# Patient Record
Sex: Female | Born: 1995 | Race: Black or African American | Hispanic: No | Marital: Single | Smoking: Never smoker
Health system: Southern US, Community
[De-identification: ages and names within clinical notes are randomized; demographics above are authoritative.]

## PROBLEM LIST (undated history)

## (undated) DIAGNOSIS — J45909 Unspecified asthma, uncomplicated: Secondary | ICD-10-CM

---
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
## 2017-01-16 ENCOUNTER — Encounter (HOSPITAL_COMMUNITY): Payer: Self-pay | Admitting: Emergency Medicine

## 2017-01-16 ENCOUNTER — Emergency Department (HOSPITAL_COMMUNITY): Payer: Managed Care, Other (non HMO)

## 2017-01-16 ENCOUNTER — Emergency Department (HOSPITAL_COMMUNITY)
Admission: EM | Admit: 2017-01-16 | Discharge: 2017-01-16 | Disposition: A | Discharge status: Home / Self Care | Payer: Managed Care, Other (non HMO) | Attending: Emergency Medicine | Admitting: Emergency Medicine

## 2017-01-16 ENCOUNTER — Other Ambulatory Visit: Payer: Self-pay

## 2017-01-16 DIAGNOSIS — Z79899 Other long term (current) drug therapy: Secondary | ICD-10-CM | POA: Diagnosis not present

## 2017-01-16 DIAGNOSIS — R0602 Shortness of breath: Secondary | ICD-10-CM | POA: Diagnosis present

## 2017-01-16 DIAGNOSIS — J4521 Mild intermittent asthma with (acute) exacerbation: Secondary | ICD-10-CM

## 2017-01-16 MED ORDER — IPRATROPIUM BROMIDE 0.02 % IN SOLN
0.5000 mg | Freq: Once | RESPIRATORY_TRACT | Status: AC
  Administered 2017-01-16: 0.5 mg via RESPIRATORY_TRACT
  Filled 2017-01-16: qty 2.5

## 2017-01-16 MED ORDER — PREDNISONE 20 MG PO TABS
60.0000 mg | ORAL_TABLET | Freq: Once | ORAL | Status: AC
  Administered 2017-01-16: 60 mg via ORAL
  Filled 2017-01-16: qty 3

## 2017-01-16 MED ORDER — ALBUTEROL (5 MG/ML) CONTINUOUS INHALATION SOLN
10.0000 mg/h | INHALATION_SOLUTION | Freq: Once | RESPIRATORY_TRACT | Status: AC
  Administered 2017-01-16: 10 mg/h via RESPIRATORY_TRACT
  Filled 2017-01-16: qty 20

## 2017-01-16 MED ORDER — PREDNISONE 20 MG PO TABS: ORAL_TABLET | ORAL | 0 refills | Status: DC

## 2017-01-16 MED ORDER — ALBUTEROL SULFATE (2.5 MG/3ML) 0.083% IN NEBU
INHALATION_SOLUTION | RESPIRATORY_TRACT | Status: AC
  Filled 2017-01-16: qty 6

## 2017-01-16 MED ORDER — ALBUTEROL SULFATE (2.5 MG/3ML) 0.083% IN NEBU
5.0000 mg | INHALATION_SOLUTION | Freq: Once | RESPIRATORY_TRACT | Status: AC
  Administered 2017-01-16: 5 mg via RESPIRATORY_TRACT

## 2017-01-16 MED ORDER — AZITHROMYCIN 250 MG PO TABS: ORAL_TABLET | ORAL | 0 refills | Status: DC

## 2017-01-16 MED ORDER — BENZONATATE 100 MG PO CAPS: 100.0000 mg | ORAL_CAPSULE | Freq: Three times a day (TID) | ORAL | 0 refills | Status: DC

## 2017-01-16 NOTE — ED Notes (Signed)
Patient verbalized understanding of discharge instructions and denies any further needs or questions at this time. VS stable. Patient ambulatory with steady gait.  

## 2017-01-16 NOTE — ED Provider Notes (Signed)
[facility] EMERGENCY [facility] Provider Note   CSN: [id number] Arrival date & time: 01/16/17  1332     History   Chief Complaint Chief Complaint  Patient presents with  . Wheezing  . Cough    HPI Janet Armstrong is a 21 y.o. female.  HPI   21 year old female with history of asthma presenting for evaluation of shortness of breath.  Since last night patient developed congestion, wheezing, increased shortness of breath, and pleuritic chest pain.  Symptoms not adequately controlled with home albuterol inhaler.  He has been using it around the clock without relief.  She does not report any fever, chills, headache, dizziness, nausea vomiting diarrhea abdominal pain or back pain or rash.  Her son is also sick.  She mentioned her asthma usually is well controlled however she normally have asthma flare around the wintertime.  Past Medical History:  Diagnosis Date  . Asthma     There are no active problems to display for this patient.   History reviewed. No pertinent surgical history.  OB History    No data available       Home Medications    Prior to Admission medications   Medication Sig Start Date End Date Taking? Authorizing Provider  albuterol (PROVENTIL HFA;VENTOLIN HFA) 108 (90 Base) MCG/ACT inhaler Inhale 2 puffs into the lungs every 6 (six) hours as needed for wheezing or shortness of breath.  08/28/16  Yes [provider]  etonogestrel (NEXPLANON) 68 MG IMPL implant 1 each by Subdermal route See admin instructions. 1 implant every three years 06/07/15  Yes [provider]  fluticasone (FLONASE) 50 MCG/ACT nasal spray Place 2 sprays into both nostrils at bedtime. Patient taking differently: Place 2 sprays into both nostrils at bedtime as needed for allergies or rhinitis.  02/27/14  Yes Ozella RocksMerrell, David J, MD  sertraline (ZOLOFT) 25 MG tablet Take 50 mg by mouth daily. 11/13/16  Yes [provider]  amoxicillin-clavulanate  (AUGMENTIN) 875-125 MG per tablet Take 1 tablet by mouth 2 (two) times daily. Patient not taking: Reported on 01/16/2017 02/27/14   Ozella RocksMerrell, David J, MD  ipratropium (ATROVENT) 0.06 % nasal spray Place 2 sprays into both nostrils 4 (four) times daily. Patient not taking: Reported on 01/16/2017 02/27/14   Ozella RocksMerrell, David J, MD    Family History Family History  Problem Relation Age of Onset  . Diabetes Other   . Hypertension Other   . Cancer Other     Social History Social History   Tobacco Use  . Smoking status: Never Smoker  . Smokeless tobacco: Never Used  Substance Use Topics  . Alcohol use: No  . Drug use: No     Allergies   Beef-derived products and Pork-derived products   Review of Systems Review of Systems  All other systems reviewed and are negative.    Physical Exam Updated Vital Signs BP 123/79 (BP Location: Right Arm)   Pulse (!) 110   Temp 98.6 F (37 C) (Oral)   Resp 20   SpO2 98%   Physical Exam  Constitutional: She appears well-developed and well-nourished. No distress.  HENT:  Head: Atraumatic.  Right Ear: External ear normal.  Left Ear: External ear normal.  Nose: Nose normal.  Mouth/Throat: Oropharynx is clear and moist.  Eyes: Conjunctivae are normal.  Neck: Normal range of motion. Neck supple.  Cardiovascular: Intact distal pulses.  Tachycardia without murmur rubs or gallops  Pulmonary/Chest: No stridor. She has wheezes. She has no rales.  Abdominal: Soft. Bowel sounds are normal.  Musculoskeletal: She exhibits no edema.  Lymphadenopathy:    She has no cervical adenopathy.  Neurological: She is alert.  Skin: No rash noted.  Psychiatric: She has a normal mood and affect.  Nursing note and vitals reviewed.    ED Treatments / Results  Labs (all labs ordered are listed, but only abnormal results are displayed) Labs Reviewed - No data to display  EKG  EKG Interpretation None      Date: 01/16/2017  Rate: 98  Rhythm: normal sinus  rhythm with sinus arrhythmia  QRS Axis: normal  Intervals: normal  ST/T Wave abnormalities: normal  Conduction Disutrbances: none  Narrative Interpretation:   Old EKG Reviewed: No significant changes noted     Radiology Dg Chest 2 View  Result Date: 01/16/2017 CLINICAL DATA:  Asthma.  Cough, wheezing, and shortness of breath. EXAM: CHEST  2 VIEW COMPARISON:  None FINDINGS: There is a small focal area of infiltrate/ atelectasis seen anteriorly on the lateral view, either in the right middle lobe or lingula. The lungs are otherwise clear. No effusions. Heart size and vascularity are normal. No bone abnormality. IMPRESSION: Focal area of infiltrate/atelectasis either in the lingula or right middle lobe. I favor it being in the lingula. Electronically Signed   By: Francene BoyersJames  Maxwell M.D.   On: 01/16/2017 15:28    Procedures Procedures (including critical care time)  Medications Ordered in ED Medications  albuterol (PROVENTIL) (2.5 MG/3ML) 0.083% nebulizer solution 5 mg ( Nebulization Not Given 01/16/17 1436)  predniSONE (DELTASONE) tablet 60 mg (60 mg Oral Given 01/16/17 1738)  albuterol (PROVENTIL,VENTOLIN) solution continuous neb (10 mg/hr Nebulization Given 01/16/17 1742)  ipratropium (ATROVENT) nebulizer solution 0.5 mg (0.5 mg Nebulization Given 01/16/17 1742)     Initial Impression / Assessment and Plan / ED Course  I have reviewed the triage vital signs and the nursing notes.  Pertinent labs & imaging results that were available during my care of the patient were reviewed by me and considered in my medical decision making (see chart for details).     BP 118/75 (BP Location: Right Arm)   Pulse (!) 108   Temp 98.6 F (37 C) (Oral)   Resp 18   SpO2 100%    Final Clinical Impressions(s) / ED Diagnoses   Final diagnoses:  Mild intermittent asthma with exacerbation    ED Discharge Orders        Ordered    predniSONE (DELTASONE) 20 MG tablet     01/16/17 1856     benzonatate (TESSALON) 100 MG capsule  Every 8 hours     01/16/17 1856    azithromycin (ZITHROMAX Z-PAK) 250 MG tablet     01/16/17 1856     5:11 PM Patient here with cough and wheezing suggestive of an asthma exacerbation.  Chest x-ray shows a focal area of infiltrate/atelectasis in the lingula of the right middle lobe.  Patient does not report any fever or productive cough to suggest pneumonia.  She is not a smoker.  6:55 PM Patient felt better after receiving breathing treatment and steroid.  She will be treated for an asthma exacerbation.  However, patient will receive a prescription for antibiotic for potential atypical pneumonia.  She will take it if his symptoms persist.  Return precautions discussed.   Fayrene Helperran, Jamear Carbonneau, PA-C 01/16/17 1857    Azalia Bilisampos, Kevin, MD 01/16/17 2156

## 2017-01-16 NOTE — ED Triage Notes (Signed)
Pt to ER for one day of cough and nasal congestion, states hx of asthma, has been utilizing inhalers at home for shortness of breath but they aren't relieving her. Inspiratory and expiratory wheezing noted on arrival. Pt in NAD however. A/o x4.

## 2018-05-16 ENCOUNTER — Emergency Department (HOSPITAL_COMMUNITY)
Admission: EM | Admit: 2018-05-16 | Discharge: 2018-05-17 | Disposition: A | Discharge status: Home / Self Care | Payer: Self-pay | Attending: Emergency Medicine | Admitting: Emergency Medicine

## 2018-05-16 ENCOUNTER — Encounter (HOSPITAL_COMMUNITY): Payer: Self-pay | Admitting: *Deleted

## 2018-05-16 ENCOUNTER — Other Ambulatory Visit: Payer: Self-pay

## 2018-05-16 DIAGNOSIS — S30811A Abrasion of abdominal wall, initial encounter: Secondary | ICD-10-CM | POA: Insufficient documentation

## 2018-05-16 DIAGNOSIS — S51831A Puncture wound without foreign body of right forearm, initial encounter: Secondary | ICD-10-CM | POA: Insufficient documentation

## 2018-05-16 DIAGNOSIS — Z203 Contact with and (suspected) exposure to rabies: Secondary | ICD-10-CM | POA: Insufficient documentation

## 2018-05-16 DIAGNOSIS — Y939 Activity, unspecified: Secondary | ICD-10-CM | POA: Insufficient documentation

## 2018-05-16 DIAGNOSIS — Z2914 Encounter for prophylactic rabies immune globin: Secondary | ICD-10-CM | POA: Insufficient documentation

## 2018-05-16 DIAGNOSIS — Y929 Unspecified place or not applicable: Secondary | ICD-10-CM | POA: Insufficient documentation

## 2018-05-16 DIAGNOSIS — R52 Pain, unspecified: Secondary | ICD-10-CM

## 2018-05-16 DIAGNOSIS — W540XXA Bitten by dog, initial encounter: Secondary | ICD-10-CM | POA: Insufficient documentation

## 2018-05-16 DIAGNOSIS — S20319A Abrasion of unspecified front wall of thorax, initial encounter: Secondary | ICD-10-CM | POA: Insufficient documentation

## 2018-05-16 DIAGNOSIS — Y999 Unspecified external cause status: Secondary | ICD-10-CM | POA: Insufficient documentation

## 2018-05-16 NOTE — ED Triage Notes (Signed)
The pt was bitten by a pit bull she has puncture wounds to ger rt forearm lt lateral abdomen and her rt ankle  It ocured approx 2000  lmp now

## 2018-05-17 ENCOUNTER — Emergency Department (HOSPITAL_COMMUNITY): Payer: Self-pay

## 2018-05-17 MED ORDER — RABIES VACCINE, PCEC IM SUSR
1.0000 mL | Freq: Once | INTRAMUSCULAR | Status: AC
  Administered 2018-05-17: 01:00:00 1 mL via INTRAMUSCULAR
  Filled 2018-05-17: qty 1

## 2018-05-17 MED ORDER — AMOXICILLIN-POT CLAVULANATE 875-125 MG PO TABS: 1.0000 | ORAL_TABLET | Freq: Two times a day (BID) | ORAL | 0 refills | Status: DC

## 2018-05-17 MED ORDER — RABIES IMMUNE GLOBULIN 150 UNIT/ML IM INJ
20.0000 [IU]/kg | INJECTION | Freq: Once | INTRAMUSCULAR | Status: AC
  Administered 2018-05-17: 02:00:00 1575 [IU] via INTRAMUSCULAR
  Filled 2018-05-17 (×2): qty 10.5

## 2018-05-17 NOTE — ED Provider Notes (Signed)
[facility] EMERGENCY DEPARTMENT Provider Note   CSN: [id number] Arrival date & time: 05/16/18  2221    History   Chief Complaint Chief Complaint  Patient presents with   Animal Bite    HPI Besse Miron is a 23 y.o. female with a hx of asthma presents to the Emergency Department complaining of acute, persistent wounds from a dog bite sustained several hours PTA.  Pt was trying to break up a fight between the 2 dogs and her cat when she was bitten.  Pt reports she does know the dogs, however they have never been vaccinated.  Pt reports cleaning the wound well with warm soap and water, along with alcohol.  Pt reports small puncture wound to the right forearm and abrasions to the left chest and abd wall.  Hemostasis achieved PTA.  Pt's last tetanus was summer 2016.  No hx of diabetes or immunocompromise.  Pt denies hitting her head or LOC.  No numbness, tingling or weakness.        The history is provided by the patient and medical records. No language interpreter was used.    Past Medical History:  Diagnosis Date   Asthma     There are no active problems to display for this patient.   History reviewed. No pertinent surgical history.   OB History   No obstetric history on file.      Home Medications    Prior to Admission medications   Medication Sig Start Date End Date Taking? Authorizing Provider  albuterol (PROVENTIL HFA;VENTOLIN HFA) 108 (90 Base) MCG/ACT inhaler Inhale 2 puffs into the lungs every 6 (six) hours as needed for wheezing or shortness of breath.  08/28/16   [provider]  amoxicillin-clavulanate (AUGMENTIN) 875-125 MG tablet Take 1 tablet by mouth 2 (two) times daily. One po bid x 7 days 05/17/18   Indi Willhite, Dahlia Client, PA-C  azithromycin (ZITHROMAX Z-PAK) 250 MG tablet 2 po day one, then 1 daily x 4 days 01/16/17   Fayrene Helper, PA-C  benzonatate (TESSALON) 100 MG capsule Take 1 capsule (100 mg total) by mouth every 8 (eight)  hours. 01/16/17   Fayrene Helper, PA-C  etonogestrel (NEXPLANON) 68 MG IMPL implant 1 each by Subdermal route See admin instructions. 1 implant every three years 06/07/15   [provider]  fluticasone (FLONASE) 50 MCG/ACT nasal spray Place 2 sprays into both nostrils at bedtime. Patient taking differently: Place 2 sprays into both nostrils at bedtime as needed for allergies or rhinitis.  02/27/14   Ozella Rocks, MD  predniSONE (DELTASONE) 20 MG tablet 2 tabs po daily x 4 days 01/16/17   Fayrene Helper, PA-C  sertraline (ZOLOFT) 25 MG tablet Take 50 mg by mouth daily. 11/13/16   [provider]    Family History Family History  Problem Relation Age of Onset   Diabetes Other    Hypertension Other    Cancer Other     Social History Social History   Tobacco Use   Smoking status: Never Smoker   Smokeless tobacco: Never Used  Substance Use Topics   Alcohol use: No   Drug use: No     Allergies   Beef-derived products and Pork-derived products   Review of Systems Review of Systems  Constitutional: Negative for fever.  Gastrointestinal: Negative for nausea and vomiting.  Musculoskeletal: Negative for arthralgias.  Skin: Positive for wound.  Allergic/Immunologic: Negative for immunocompromised state.  Neurological: Negative for weakness and numbness.  Hematological: Does not  bruise/bleed easily.  Psychiatric/Behavioral: The patient is not nervous/anxious.      Physical Exam Updated Vital Signs BP 114/61    Pulse 77    Temp 98.4 F (36.9 C)    Resp 18    Ht 5\' 6"  (1.676 m)    Wt 77.1 kg    LMP 05/16/2018    SpO2 99%    BMI 27.44 kg/m   Physical Exam Vitals signs and nursing note reviewed.  Constitutional:      General: She is not in acute distress.    Appearance: She is well-developed.  HENT:     Head: Normocephalic.  Eyes:     General: No scleral icterus.    Conjunctiva/sclera: Conjunctivae normal.  Neck:     Musculoskeletal: Normal range of  motion.  Cardiovascular:     Rate and Rhythm: Normal rate and regular rhythm.     Pulses:          Radial pulses are 2+ on the right side and 2+ on the left side.  Pulmonary:     Effort: Pulmonary effort is normal.  Chest:     Chest wall: Tenderness present.       Comments: Several linear abrasions to the left lower chest and upper abd with superficial ecchymosis.  No flail segment, open lacerations or significant TTP.   Abdominal:     General: There is no distension.     Palpations: Abdomen is soft.     Tenderness: There is no abdominal tenderness.     Comments: Soft and nontender  Musculoskeletal: Normal range of motion.     Right shoulder: Normal.     Right elbow: Normal.    Right wrist: Normal.     Left forearm: She exhibits swelling and laceration.     Comments: Right forearm with 3 small puncture wounds to the palmar side with associated mild swelling and ecchymosis.  Hemostasis achieved.   Full ROM of the elbow and wrist without pain including pronation and supination.   Skin:    General: Skin is warm and dry.  Neurological:     Mental Status: She is alert.     Comments: Sensation intact to normal touch in the RUE through the fingertips. Strength 5/5 in the RUE including grip strength.       ED Treatments / Results    Dg Forearm Right  Result Date: 05/17/2018 CLINICAL DATA:  Dog bite to forearm EXAM: LEFT FOREARM - 2 VIEW COMPARISON:  None. FINDINGS: There is no evidence of fracture or other focal bone lesions. Soft tissues are unremarkable. IMPRESSION: Negative. Electronically Signed   By: Charlett Nose M.D.   On: 05/17/2018 01:07      Procedures Procedures (including critical care time)  Medications Ordered in ED Medications  rabies vaccine (RABAVERT) injection 1 mL (1 mL Intramuscular Given 05/17/18 0122)  rabies immune globulin (HYPERAB/KEDRAB) injection 1,575 Units (1,575 Units Intramuscular Given 05/17/18 0130)     Initial Impression / Assessment and  Plan / ED Course  I have reviewed the triage vital signs and the nursing notes.  Pertinent labs & imaging results that were available during my care of the patient were reviewed by me and considered in my medical decision making (see chart for details).  Clinical Course as of May 17 150  Sat May 17, 2018  0028 Discussed with pharmacy pt's Beef-derived product allergy and they feel the Rabies vaccine is safe to give.     [HM]  0151 Pt without  adverse reaction to Rabies vaccine   [HM]    Clinical Course User Index [HM] Eitan Doubleday, Dahlia ClientHannah, PA-C        Patient presents with laceration from a dog bite.  Pt wounds irrigated well with 18ga angiocath with sterile saline.  Wounds examined with visualization of the base and no foreign bodies seen.  Pt Alert and oriented, NAD, nontoxic, nonseptic appearing.  Capillary refill intact and pt without neurologic deficit.  Right forearm x-rays without fracture or foreign body (x-ray listed as left however injury is to the right forearm and pt reports right forearm is what was x-rayed.  X-ray labeled as right).  Patient tetanus updated.  Patient rabies vaccine and immunoglobulin risk and benefit discussed.  Pt consents. Pain treated in the emergency department. Wounds not closed secondary to concern for infection. We'll discharge home with pain medication, Augmentin and requests for close follow-up with PCP or back in the ER.     Final Clinical Impressions(s) / ED Diagnoses   Final diagnoses:  Dog bite, initial encounter  Encounter for prophylactic administration of rabies immune globulin    ED Discharge Orders         Ordered    amoxicillin-clavulanate (AUGMENTIN) 875-125 MG tablet  2 times daily     05/17/18 0147           Mesa Janus, Boyd KerbsHannah, PA-C 05/17/18 0152    Zadie RhineWickline, Donald, MD 05/17/18 0330

## 2018-05-17 NOTE — Discharge Instructions (Addendum)
1. Medications: Augmentin, usual home medications 2. Treatment: rest, drink plenty of fluids, keep wounds clean with warm soap and water 3. Follow Up: Please followup with your primary doctor in 2-3 days for discussion of your diagnoses and further evaluation after today's visit; if you do not have a primary care doctor use the resource guide provided to find one; Please return to the ER for signs of infection, worsening pain or other concerns

## 2018-05-19 ENCOUNTER — Emergency Department (HOSPITAL_COMMUNITY)
Admission: EM | Admit: 2018-05-19 | Discharge: 2018-05-19 | Disposition: A | Discharge status: Home / Self Care | Payer: Medicaid Other | Attending: Emergency Medicine | Admitting: Emergency Medicine

## 2018-05-19 ENCOUNTER — Other Ambulatory Visit: Payer: Self-pay

## 2018-05-19 ENCOUNTER — Encounter (HOSPITAL_COMMUNITY): Payer: Self-pay

## 2018-05-19 DIAGNOSIS — W540XXD Bitten by dog, subsequent encounter: Secondary | ICD-10-CM | POA: Insufficient documentation

## 2018-05-19 DIAGNOSIS — Z79899 Other long term (current) drug therapy: Secondary | ICD-10-CM | POA: Insufficient documentation

## 2018-05-19 DIAGNOSIS — S20311D Abrasion of right front wall of thorax, subsequent encounter: Secondary | ICD-10-CM | POA: Insufficient documentation

## 2018-05-19 DIAGNOSIS — Z23 Encounter for immunization: Secondary | ICD-10-CM

## 2018-05-19 MED ORDER — RABIES VACCINE, PCEC IM SUSR
1.0000 mL | Freq: Once | INTRAMUSCULAR | Status: AC
  Administered 2018-05-19: 1 mL via INTRAMUSCULAR
  Filled 2018-05-19: qty 1

## 2018-05-19 NOTE — Discharge Instructions (Signed)
Please return here or to urgent care for Rabies vaccine on Day 7 (May 1st) and Day 14 (May 8th). Thank you for allowing me to care for you today. Please return to the emergency department if you have new or worsening symptoms. Take your medications as instructed.

## 2018-05-19 NOTE — ED Triage Notes (Signed)
Pt was bitten by a dog 4/24 and is here for her next series of rabies injections. No other complaints.

## 2018-05-19 NOTE — ED Provider Notes (Signed)
[facility] EMERGENCY DEPARTMENT Provider Note   CSN: [id number] Arrival date & time: 05/19/18  1523    History   Chief Complaint Chief Complaint  Patient presents with   Rabies Injection    HPI Janet Armstrong is a 23 y.o. female.     Patient is a 23 year old female with past medical history emergency female.  Patient, no dog rabies status on May 24 of this month.  She was given her rabies immunoglobulin and rabies vaccine shot that day.  Today is day 3 and she presents for the second 1.  Reports no problem with her wound.  She is taking Augmentin.     Past Medical History:  Diagnosis Date   Asthma     There are no active problems to display for this patient.   History reviewed. No pertinent surgical history.   OB History   No obstetric history on file.      Home Medications    Prior to Admission medications   Medication Sig Start Date End Date Taking? Authorizing Provider  albuterol (PROVENTIL HFA;VENTOLIN HFA) 108 (90 Base) MCG/ACT inhaler Inhale 2 puffs into the lungs every 6 (six) hours as needed for wheezing or shortness of breath.  08/28/16   [provider]  amoxicillin-clavulanate (AUGMENTIN) 875-125 MG tablet Take 1 tablet by mouth 2 (two) times daily. One po bid x 7 days 05/17/18   Muthersbaugh, Dahlia Client, PA-C  azithromycin (ZITHROMAX Z-PAK) 250 MG tablet 2 po day one, then 1 daily x 4 days 01/16/17   Fayrene Helper, PA-C  benzonatate (TESSALON) 100 MG capsule Take 1 capsule (100 mg total) by mouth every 8 (eight) hours. 01/16/17   Fayrene Helper, PA-C  etonogestrel (NEXPLANON) 68 MG IMPL implant 1 each by Subdermal route See admin instructions. 1 implant every three years 06/07/15   [provider]  fluticasone (FLONASE) 50 MCG/ACT nasal spray Place 2 sprays into both nostrils at bedtime. Patient taking differently: Place 2 sprays into both nostrils at bedtime as needed for allergies or rhinitis.  02/27/14   Ozella Rocks, MD    predniSONE (DELTASONE) 20 MG tablet 2 tabs po daily x 4 days 01/16/17   Fayrene Helper, PA-C  sertraline (ZOLOFT) 25 MG tablet Take 50 mg by mouth daily. 11/13/16   [provider]    Family History Family History  Problem Relation Age of Onset   Diabetes Other    Hypertension Other    Cancer Other     Social History Social History   Tobacco Use   Smoking status: Never Smoker   Smokeless tobacco: Never Used  Substance Use Topics   Alcohol use: No   Drug use: No     Allergies   Beef-derived products and Pork-derived products   Review of Systems Review of Systems  Constitutional: Negative for chills and fever.  Skin: Positive for wound. Negative for color change, pallor and rash.  Allergic/Immunologic: Negative for immunocompromised state.     Physical Exam Updated Vital Signs BP 120/72 (BP Location: Left Arm)    Pulse 90    Temp 99.1 F (37.3 C) (Oral)    Resp 16    LMP 05/16/2018    SpO2 99%   Physical Exam Vitals signs and nursing note reviewed.  Constitutional:      Appearance: Normal appearance.  HENT:     Head: Normocephalic.  Eyes:     Conjunctiva/sclera: Conjunctivae normal.  Pulmonary:     Effort: Pulmonary effort is normal.  Skin:    General: Skin is dry.     Comments: Superficial, healing abrasions to the R lower rib cage. No erythema, swelling, discharge or other signs of infection   Neurological:     Mental Status: She is alert.  Psychiatric:        Mood and Affect: Mood normal.      ED Treatments / Results  Labs (all labs ordered are listed, but only abnormal results are displayed) Labs Reviewed - No data to display  EKG None  Radiology No results found.  Procedures Procedures (including critical care time)  Medications Ordered in ED Medications  rabies vaccine (RABAVERT) injection 1 mL (has no administration in time range)     Initial Impression / Assessment and Plan / ED Course  I have reviewed the triage  vital signs and the nursing notes.  Pertinent labs & imaging results that were available during my care of the patient were reviewed by me and considered in my medical decision making (see chart for details).  Clinical Course as of May 19 1647  Mon May 19, 2018  1648 Patient advised to continue Augmentin and to continue to put Neosporin and bacitracin on her wound.  She was advised on the schedule of when to return for her further injections which includes mainly first and 1 week after that.  Patient is not immunocompromise and there will not need a shot on day 28.   [KM]    Clinical Course User Index [KM] Arlyn DunningMcLean, Ronny Korff A, PA-C       Based on review of vitals, medical screening exam, lab work and/or imaging, there does not appear to be an acute, emergent etiology for the patient's symptoms. Counseled pt on good return precautions and encouraged both PCP and ED follow-up as needed.  Prior to discharge, I also discussed incidental imaging findings with patient in detail and advised appropriate, recommended follow-up in detail.  Clinical Impression: 1. Need for rabies vaccination     Disposition: Discharge  Prior to providing a prescription for a controlled substance, I independently reviewed the patient's recent prescription history on the [place] Controlled Substance Reporting System. The patient had no recent or regular prescriptions and was deemed appropriate for a brief, less than 3 day prescription of narcotic for acute analgesia.  This note was prepared with assistance of Conservation officer, historic buildingsDragon voice recognition software. Occasional wrong-word or sound-a-like substitutions may have occurred due to the inherent limitations of voice recognition software.    Final Clinical Impressions(s) / ED Diagnoses   Final diagnoses:  Need for rabies vaccination    ED Discharge Orders    None       Jeral PinchMcLean, Christophere Hillhouse A, PA-C 05/19/18 1649    Niel HummerKuhner, Ross, MD 05/19/18 2304

## 2018-05-25 ENCOUNTER — Other Ambulatory Visit: Payer: Self-pay

## 2018-05-25 ENCOUNTER — Emergency Department (HOSPITAL_COMMUNITY)
Admission: EM | Admit: 2018-05-25 | Discharge: 2018-05-25 | Disposition: A | Discharge status: Home / Self Care | Payer: Medicaid Other | Attending: Emergency Medicine | Admitting: Emergency Medicine

## 2018-05-25 ENCOUNTER — Encounter (HOSPITAL_COMMUNITY): Payer: Self-pay | Admitting: Emergency Medicine

## 2018-05-25 DIAGNOSIS — Z203 Contact with and (suspected) exposure to rabies: Secondary | ICD-10-CM | POA: Insufficient documentation

## 2018-05-25 DIAGNOSIS — J45909 Unspecified asthma, uncomplicated: Secondary | ICD-10-CM | POA: Insufficient documentation

## 2018-05-25 DIAGNOSIS — Z23 Encounter for immunization: Secondary | ICD-10-CM | POA: Insufficient documentation

## 2018-05-25 MED ORDER — RABIES VACCINE, PCEC IM SUSR
1.0000 mL | Freq: Once | INTRAMUSCULAR | Status: AC
  Administered 2018-05-25: 1 mL via INTRAMUSCULAR
  Filled 2018-05-25: qty 1

## 2018-05-25 NOTE — ED Provider Notes (Signed)
[facility] EMERGENCY DEPARTMENT Provider Note   CSN: [id number] Arrival date & time: 05/25/18  1648    History   Chief Complaint Chief Complaint  Patient presents with  . Rabies Injection    HPI Janet Armstrong is a 23 y.o. female.     HPI   Janet Armstrong is a 23 y.o. female, with a history of asthma, presenting to the ED for her third rabies vaccine injection.  This injection was due May 1, which would have been day 7 of the regimen, but she states she got the date wrong.  She has not had any difficulty with her wounds.  She did not have any adverse reaction to the previous injections. Denies fever, headaches, pain, swelling, or other complaints.    Past Medical History:  Diagnosis Date  . Asthma     There are no active problems to display for this patient.   History reviewed. No pertinent surgical history.   OB History   No obstetric history on file.      Home Medications    Prior to Admission medications   Medication Sig Start Date End Date Taking? Authorizing Provider  albuterol (PROVENTIL HFA;VENTOLIN HFA) 108 (90 Base) MCG/ACT inhaler Inhale 2 puffs into the lungs every 6 (six) hours as needed for wheezing or shortness of breath.  08/28/16   [provider]  amoxicillin-clavulanate (AUGMENTIN) 875-125 MG tablet Take 1 tablet by mouth 2 (two) times daily. One po bid x 7 days 05/17/18   Muthersbaugh, Dahlia Client, PA-C  azithromycin (ZITHROMAX Z-PAK) 250 MG tablet 2 po day one, then 1 daily x 4 days 01/16/17   Fayrene Helper, PA-C  benzonatate (TESSALON) 100 MG capsule Take 1 capsule (100 mg total) by mouth every 8 (eight) hours. 01/16/17   Fayrene Helper, PA-C  etonogestrel (NEXPLANON) 68 MG IMPL implant 1 each by Subdermal route See admin instructions. 1 implant every three years 06/07/15   [provider]  fluticasone (FLONASE) 50 MCG/ACT nasal spray Place 2 sprays into both nostrils at bedtime. Patient taking differently: Place 2 sprays  into both nostrils at bedtime as needed for allergies or rhinitis.  02/27/14   Ozella Rocks, MD  predniSONE (DELTASONE) 20 MG tablet 2 tabs po daily x 4 days 01/16/17   Fayrene Helper, PA-C  sertraline (ZOLOFT) 25 MG tablet Take 50 mg by mouth daily. 11/13/16   [provider]    Family History Family History  Problem Relation Age of Onset  . Diabetes Other   . Hypertension Other   . Cancer Other     Social History Social History   Tobacco Use  . Smoking status: Never Smoker  . Smokeless tobacco: Never Used  Substance Use Topics  . Alcohol use: No  . Drug use: No     Allergies   Beef-derived products and Pork-derived products   Review of Systems Review of Systems  Constitutional: Negative for fever.       Rabies vaccine  Gastrointestinal: Negative for nausea and vomiting.  Skin: Positive for wound.  Neurological: Negative for dizziness, syncope, weakness, numbness and headaches.  Psychiatric/Behavioral: Negative for confusion.     Physical Exam Updated Vital Signs BP 106/60 (BP Location: Left Arm)   Pulse 92   Temp 98.4 F (36.9 C) (Oral)   Resp 17   LMP 05/16/2018   SpO2 99%   Physical Exam Vitals signs and nursing note reviewed.  Constitutional:      General: She is not in  acute distress.    Appearance: She is well-developed. She is not diaphoretic.  HENT:     Head: Normocephalic and atraumatic.  Eyes:     Conjunctiva/sclera: Conjunctivae normal.  Neck:     Musculoskeletal: Neck supple.  Cardiovascular:     Rate and Rhythm: Normal rate and regular rhythm.  Pulmonary:     Effort: Pulmonary effort is normal.  Skin:    General: Skin is warm and dry.     Coloration: Skin is not pale.     Comments: Patient's wounds on her arm and abdomen are closed and appear to be healing well.  No erythema, swelling, tenderness, or increased warmth.  Neurological:     Mental Status: She is alert.  Psychiatric:        Behavior: Behavior normal.       ED Treatments / Results  Labs (all labs ordered are listed, but only abnormal results are displayed) Labs Reviewed - No data to display  EKG None  Radiology No results found.  Procedures Procedures (including critical care time)  Medications Ordered in ED Medications  rabies vaccine (RABAVERT) injection 1 mL (1 mL Intramuscular Given 05/25/18 1731)     Initial Impression / Assessment and Plan / ED Course  I have reviewed the triage vital signs and the nursing notes.  Pertinent labs & imaging results that were available during my care of the patient were reviewed by me and considered in my medical decision making (see chart for details).  Clinical Course as of May 24 2309  Wynelle LinkSun May 25, 2018  1706 Noted patient in waiting room. Ordered the Rabies vaccine to be sent from pharmacy so it will be ready to be administered by the time patient is shown to a room.   [SJ]    Clinical Course User Index [SJ] ,  C, PA-C       Patient was given the third dose of rabies vaccine.  She should have her final dose administered May 8 (day 14). Return precautions discussed.  Patient voices understanding of these instructions, accepts the plan, and is comfortable with discharge.  Final Clinical Impressions(s) / ED Diagnoses   Final diagnoses:  Need for rabies vaccination  Need for post exposure prophylaxis for rabies    ED Discharge Orders    None       Concepcion Living,  C, PA-C 05/25/18 2311    Arby BarrettePfeiffer, Marcy, MD 06/02/18 [phone]

## 2018-05-25 NOTE — Discharge Instructions (Signed)
Be sure to return for your last injection on May 8.  Your wound appears to be healing well. Continue to keep the area clean and dry.   May apply ointments or lotions such as Aquaphor to the area to reduce scarring. The key is to keep the skin well hydrated and supple. It is also important to stay well hydrated by drinking plenty of water. Keep your scar protected from the sun. Cover the scar with sunscreen that has an SPF (sun protection factor) of 30 or higher. Do not put sunscreen on your scar until it has healed. Gently massage the scar using a circular motion. This will help to minimize the appearance of the scar. Do this only after the incision has closed and all of the sutures have been removed. Remember that the scar may appear lighter or darker than your normal skin color. This difference in color should even out with time. If your scar does not fade or go away with time and you do not like how it looks, consider talking with a Engineer, petroleum or a dermatologist.

## 2018-05-25 NOTE — ED Triage Notes (Signed)
PT is here for next rabies injection.  Denies any complaints.

## 2018-05-30 ENCOUNTER — Emergency Department (HOSPITAL_COMMUNITY)
Admission: EM | Admit: 2018-05-30 | Discharge: 2018-05-30 | Disposition: A | Discharge status: Home / Self Care | Payer: Self-pay | Attending: Emergency Medicine | Admitting: Emergency Medicine

## 2018-05-30 ENCOUNTER — Other Ambulatory Visit: Payer: Self-pay

## 2018-05-30 ENCOUNTER — Encounter (HOSPITAL_COMMUNITY): Payer: Self-pay | Admitting: Emergency Medicine

## 2018-05-30 DIAGNOSIS — J45909 Unspecified asthma, uncomplicated: Secondary | ICD-10-CM | POA: Insufficient documentation

## 2018-05-30 DIAGNOSIS — Z23 Encounter for immunization: Secondary | ICD-10-CM | POA: Insufficient documentation

## 2018-05-30 DIAGNOSIS — Z203 Contact with and (suspected) exposure to rabies: Secondary | ICD-10-CM

## 2018-05-30 DIAGNOSIS — Z79899 Other long term (current) drug therapy: Secondary | ICD-10-CM | POA: Insufficient documentation

## 2018-05-30 MED ORDER — RABIES VACCINE, PCEC IM SUSR
1.0000 mL | Freq: Once | INTRAMUSCULAR | Status: AC
  Administered 2018-05-30: 19:00:00 1 mL via INTRAMUSCULAR
  Filled 2018-05-30: qty 1

## 2018-05-30 NOTE — Discharge Instructions (Addendum)
Today you received your final rabies vaccine.

## 2018-05-30 NOTE — ED Triage Notes (Signed)
Patient here for her 14-day follow-up for rabies vaccine. Denies any new symptoms.

## 2018-05-30 NOTE — ED Provider Notes (Signed)
[facility]GENC[facility]PART[facility] Provider Note   CSN: [id number] Arrival date & time: 05/30/18  [id number]    History   Chief Complaint Chief Complaint  Patient presents with  . Rabies Vaccine Follow-Up    HPI Janet Armstrong is a 23 y.o. female.     23 y.o female with a PMH of asthma presents to the ED for her 4th rabies vaccines.  Patient reports she attempted to go to the urgent care however there was nobody in the parking lot.  Patient denies any complaints at this time reports she was late for her last vaccine but this is her last 1 due today.  No complaints on today's visit.     Past Medical History:  Diagnosis Date  . Asthma     There are no active problems to display for this patient.   History reviewed. No pertinent surgical history.   OB History   No obstetric history on file.      Home Medications    Prior to Admission medications   Medication Sig Start Date End Date Taking? Authorizing Provider  albuterol (PROVENTIL HFA;VENTOLIN HFA) 108 (90 Base) MCG/ACT inhaler Inhale 2 puffs into the lungs every 6 (six) hours as needed for wheezing or shortness of breath.  08/28/16   [provider]  amoxicillin-clavulanate (AUGMENTIN) 875-125 MG tablet Take 1 tablet by mouth 2 (two) times daily. One po bid x 7 days 05/17/18   Muthersbaugh, Dahlia ClientHannah, PA-C  azithromycin (ZITHROMAX Z-PAK) 250 MG tablet 2 po day one, then 1 daily x 4 days 01/16/17   Fayrene Helperran, Bowie, PA-C  benzonatate (TESSALON) 100 MG capsule Take 1 capsule (100 mg total) by mouth every 8 (eight) hours. 01/16/17   Fayrene Helperran, Bowie, PA-C  etonogestrel (NEXPLANON) 68 MG IMPL implant 1 each by Subdermal route See admin instructions. 1 implant every three years 06/07/15   [provider]  fluticasone (FLONASE) 50 MCG/ACT nasal spray Place 2 sprays into both nostrils at bedtime. Patient taking differently: Place 2 sprays into both nostrils at bedtime as needed for allergies or rhinitis.  02/27/14    Ozella RocksMerrell, David J, MD  predniSONE (DELTASONE) 20 MG tablet 2 tabs po daily x 4 days 01/16/17   Fayrene Helperran, Bowie, PA-C  sertraline (ZOLOFT) 25 MG tablet Take 50 mg by mouth daily. 11/13/16   [provider]    Family History Family History  Problem Relation Age of Onset  . Diabetes Other   . Hypertension Other   . Cancer Other     Social History Social History   Tobacco Use  . Smoking status: Never Smoker  . Smokeless tobacco: Never Used  Substance Use Topics  . Alcohol use: Yes    Comment: social  . Drug use: No     Allergies   Beef-derived products and Pork-derived products   Review of Systems Review of Systems  Constitutional: Negative for fever.  Respiratory: Negative for shortness of breath.   Cardiovascular: Negative for chest pain.  Skin: Negative for rash.     Physical Exam Updated Vital Signs BP 106/68 (BP Location: Right Arm)   Pulse 79   Temp 99 F (37.2 C) (Oral)   Resp 16   LMP 05/16/2018   SpO2 99%   Physical Exam Vitals signs and nursing note reviewed.  Constitutional:      General: She is not in acute distress.    Appearance: She is well-developed.  HENT:     Head: Normocephalic and atraumatic.  Mouth/Throat:     Pharynx: No oropharyngeal exudate.  Eyes:     Pupils: Pupils are equal, round, and reactive to light.  Neck:     Musculoskeletal: Normal range of motion.  Cardiovascular:     Rate and Rhythm: Regular rhythm.     Heart sounds: Normal heart sounds.  Pulmonary:     Effort: Pulmonary effort is normal. No respiratory distress.     Breath sounds: Normal breath sounds.  Abdominal:     General: Bowel sounds are normal. There is no distension.     Palpations: Abdomen is soft.     Tenderness: There is no abdominal tenderness.  Musculoskeletal:        General: No tenderness or deformity.     Right lower leg: No edema.     Left lower leg: No edema.  Skin:    General: Skin is warm and dry.  Neurological:     Mental  Status: She is alert and oriented to person, place, and time.      ED Treatments / Results  Labs (all labs ordered are listed, but only abnormal results are displayed) Labs Reviewed - No data to display  EKG None  Radiology No results found.  Procedures Procedures (including critical care time)  Medications Ordered in ED Medications  rabies vaccine (RABAVERT) injection 1 mL (has no administration in time range)     Initial Impression / Assessment and Plan / ED Course  I have reviewed the triage vital signs and the nursing notes.  Pertinent labs & imaging results that were available during my care of the patient were reviewed by me and considered in my medical decision making (see chart for details).    Patient presents requesting her last dose of rabies vaccine, today is day 14 for patient.  She reports no complaints at this time.  Order placed for last rabies vaccine dose.    7:41 PM patient has received her last rabies vaccine at this time.  Follow-up as needed with PCP encouraged.  Portions of this note were generated with Scientist, clinical (histocompatibility and immunogenetics). Dictation errors may occur despite best attempts at proofreading.    Final Clinical Impressions(s) / ED Diagnoses   Final diagnoses:  Rabies exposure    ED Discharge Orders    None       Claude Manges, PA-C 05/30/18 1941    Melene Plan, DO 05/30/18 2116

## 2020-06-24 ENCOUNTER — Encounter (HOSPITAL_COMMUNITY): Payer: Self-pay | Admitting: Emergency Medicine

## 2020-06-24 ENCOUNTER — Ambulatory Visit (HOSPITAL_COMMUNITY)
Admission: EM | Admit: 2020-06-24 | Discharge: 2020-06-24 | Disposition: A | Discharge status: Home / Self Care | Payer: Medicaid Other | Attending: Emergency Medicine | Admitting: Emergency Medicine

## 2020-06-24 ENCOUNTER — Other Ambulatory Visit: Payer: Self-pay

## 2020-06-24 DIAGNOSIS — J069 Acute upper respiratory infection, unspecified: Secondary | ICD-10-CM | POA: Insufficient documentation

## 2020-06-24 DIAGNOSIS — Z79899 Other long term (current) drug therapy: Secondary | ICD-10-CM | POA: Diagnosis not present

## 2020-06-24 DIAGNOSIS — J45909 Unspecified asthma, uncomplicated: Secondary | ICD-10-CM | POA: Insufficient documentation

## 2020-06-24 DIAGNOSIS — Z20822 Contact with and (suspected) exposure to covid-19: Secondary | ICD-10-CM | POA: Insufficient documentation

## 2020-06-24 LAB — SARS CORONAVIRUS 2 (TAT 6-24 HRS): SARS Coronavirus 2: NEGATIVE

## 2020-06-24 LAB — POC INFLUENZA A AND B ANTIGEN (URGENT CARE ONLY)
INFLUENZA A ANTIGEN, POC: NEGATIVE
INFLUENZA B ANTIGEN, POC: NEGATIVE

## 2020-06-24 MED ORDER — SUMATRIPTAN SUCCINATE 6 MG/0.5ML ~~LOC~~ SOLN
6.0000 mg | Freq: Once | SUBCUTANEOUS | Status: AC
  Administered 2020-06-24: 6 mg via SUBCUTANEOUS

## 2020-06-24 MED ORDER — DEXAMETHASONE SODIUM PHOSPHATE 10 MG/ML IJ SOLN
INTRAMUSCULAR | Status: AC
  Filled 2020-06-24: qty 1

## 2020-06-24 MED ORDER — FLUTICASONE PROPIONATE 50 MCG/ACT NA SUSP: 1.0000 | Freq: Two times a day (BID) | NASAL | 0 refills | Status: AC

## 2020-06-24 MED ORDER — SUMATRIPTAN SUCCINATE 6 MG/0.5ML ~~LOC~~ SOLN
SUBCUTANEOUS | Status: AC
  Filled 2020-06-24: qty 0.5

## 2020-06-24 MED ORDER — DEXAMETHASONE SODIUM PHOSPHATE 10 MG/ML IJ SOLN
10.0000 mg | Freq: Once | INTRAMUSCULAR | Status: AC
  Administered 2020-06-24: 10 mg via INTRAMUSCULAR

## 2020-06-24 NOTE — Discharge Instructions (Addendum)
Can use flonase 1 spray into each nostril twice a day to help with congestion   Can use ibuprofen 600-800 mg every 6 hours as needed for headache  Salt water gargles, hard candies, hot liquids to help with sore throat

## 2020-06-24 NOTE — ED Triage Notes (Signed)
Headache for 2 days Cough, congestion, sore throat for 2 days

## 2020-06-24 NOTE — ED Provider Notes (Addendum)
[facility]    CSN: [id number] Arrival date & time: 06/24/20  7353      History   Chief Complaint Chief Complaint  Patient presents with  . Headache  . Nasal Congestion    HPI Janet Armstrong is a 25 y.o. female.   Patient presents with constant frontal headache worsened by light when at its peak, intermittently making her nausea for 2 days. Has non productive cough, nasal congestion and mild sore throat for 2 days as well. Has been taking family member's rizatriptan for headache relief, lessens pain from 10 to 5. Denies history of migraines. Denies fever, chills, sob, chest pain, body aches, ear pain. No known sick contact.   Past Medical History:  Diagnosis Date  . Asthma     There are no problems to display for this patient.   History reviewed. No pertinent surgical history.  OB History   No obstetric history on file.      Home Medications    Prior to Admission medications   Medication Sig Start Date End Date Taking? Authorizing Provider  albuterol (PROVENTIL HFA;VENTOLIN HFA) 108 (90 Base) MCG/ACT inhaler Inhale 2 puffs into the lungs every 6 (six) hours as needed for wheezing or shortness of breath.  08/28/16   [provider]  amoxicillin-clavulanate (AUGMENTIN) 875-125 MG tablet Take 1 tablet by mouth 2 (two) times daily. One po bid x 7 days 05/17/18   Muthersbaugh, Dahlia Client, PA-C  azithromycin (ZITHROMAX Z-PAK) 250 MG tablet 2 po day one, then 1 daily x 4 days 01/16/17   Fayrene Helper, PA-C  benzonatate (TESSALON) 100 MG capsule Take 1 capsule (100 mg total) by mouth every 8 (eight) hours. 01/16/17   Fayrene Helper, PA-C  etonogestrel (NEXPLANON) 68 MG IMPL implant 1 each by Subdermal route See admin instructions. 1 implant every three years 06/07/15   [provider]  fluticasone (FLONASE) 50 MCG/ACT nasal spray Place 1 spray into both nostrils in the morning and at bedtime. 06/24/20   Valinda Hoar, NP  predniSONE (DELTASONE) 20 MG tablet 2  tabs po daily x 4 days 01/16/17   Fayrene Helper, PA-C  sertraline (ZOLOFT) 25 MG tablet Take 50 mg by mouth daily. 11/13/16   [provider]    Family History Family History  Problem Relation Age of Onset  . Diabetes Other   . Hypertension Other   . Cancer Other     Social History Social History   Tobacco Use  . Smoking status: Never Smoker  . Smokeless tobacco: Never Used  Substance Use Topics  . Alcohol use: Yes    Comment: social  . Drug use: No     Allergies   Beef-derived products and Pork-derived products   Review of Systems Review of Systems  Defer to HPI    Physical Exam Triage Vital Signs ED Triage Vitals  Enc Vitals Group     BP 06/24/20 0858 105/66     Pulse Rate 06/24/20 0858 68     Resp 06/24/20 0858 16     Temp 06/24/20 0858 98.8 F (37.1 C)     Temp Source 06/24/20 0858 Oral     SpO2 06/24/20 0858 98 %     Weight --      Height --      Head Circumference --      Peak Flow --      Pain Score 06/24/20 0856 4     Pain Loc --      Pain  Edu? --      Excl. in GC? --    No data found.  Updated Vital Signs BP 105/66   Pulse 68   Temp 98.8 F (37.1 C) (Oral)   Resp 16   LMP 06/17/2020   SpO2 98%   Visual Acuity Right Eye Distance:   Left Eye Distance:   Bilateral Distance:    Right Eye Near:   Left Eye Near:    Bilateral Near:     Physical Exam Constitutional:      Appearance: She is well-developed and normal weight.  HENT:     Head: Normocephalic.     Right Ear: Hearing, ear canal and external ear normal. A middle ear effusion is present.     Left Ear: Hearing, ear canal and external ear normal. A middle ear effusion is present.     Nose: Congestion and rhinorrhea present.     Mouth/Throat:     Mouth: Mucous membranes are moist.     Pharynx: Posterior oropharyngeal erythema present.  Eyes:     Extraocular Movements: Extraocular movements intact.     Conjunctiva/sclera: Conjunctivae normal.     Pupils: Pupils are  equal, round, and reactive to light.  Cardiovascular:     Rate and Rhythm: Normal rate and regular rhythm.     Pulses: Normal pulses.     Heart sounds: Normal heart sounds.  Pulmonary:     Effort: Pulmonary effort is normal.     Breath sounds: Normal breath sounds.  Musculoskeletal:        General: Normal range of motion.     Cervical back: Normal range of motion.  Lymphadenopathy:     Cervical: Cervical adenopathy present.  Skin:    General: Skin is warm and dry.  Neurological:     General: No focal deficit present.     Mental Status: She is alert and oriented to person, place, and time. Mental status is at baseline.  Psychiatric:        Mood and Affect: Mood normal.        Behavior: Behavior normal.        Thought Content: Thought content normal.        Judgment: Judgment normal.      UC Treatments / Results  Labs (all labs ordered are listed, but only abnormal results are displayed) Labs Reviewed  SARS CORONAVIRUS 2 (TAT 6-24 HRS)  POC INFLUENZA A AND B ANTIGEN (URGENT CARE ONLY)    EKG   Radiology No results found.  Procedures Procedures (including critical care time)  Medications Ordered in UC Medications  SUMAtriptan (IMITREX) injection 6 mg (6 mg Subcutaneous Given 06/24/20 0929)  dexamethasone (DECADRON) injection 10 mg (10 mg Intramuscular Given 06/24/20 0931)    Initial Impression / Assessment and Plan / UC Course  I have reviewed the triage vital signs and the nursing notes.  Pertinent labs & imaging results that were available during my care of the patient were reviewed by me and considered in my medical decision making (see chart for details).  Viral URI  1. imitrex 6 mg IM now 2. Decadron 10 mg IM now 3. Advised ibuprofen 600-800 tid prn for headache management at home 4. flonase 50 mcg 1 spray each nare bid 5. Salt water gargles, hard candies, hot liquids for sore throat  5. covid- pending 6. Flu- negative    Final Clinical Impressions(s) /  UC Diagnoses   Final diagnoses:  Viral URI     Discharge Instructions  Can use flonase 1 spray into each nostril twice a day to help with congestion   Can use ibuprofen 600-800 mg every 6 hours as needed for headache  Salt water gargles, hard candies, hot liquids to help with sore throat    ED Prescriptions    Medication Sig Dispense Auth. Provider   fluticasone (FLONASE) 50 MCG/ACT nasal spray Place 1 spray into both nostrils in the morning and at bedtime. 16 g Valinda Hoar, NP     PDMP not reviewed this encounter.   Valinda Hoar, NP 06/24/20 1009    Valinda Hoar, NP 06/24/20 1010    Salli Quarry R, [place] 06/24/20 1010

## 2020-08-14 ENCOUNTER — Other Ambulatory Visit: Payer: Self-pay

## 2020-08-14 ENCOUNTER — Emergency Department (HOSPITAL_COMMUNITY): Payer: Medicaid Other

## 2020-08-14 ENCOUNTER — Encounter (HOSPITAL_COMMUNITY): Payer: Self-pay

## 2020-08-14 ENCOUNTER — Emergency Department (HOSPITAL_COMMUNITY)
Admission: EM | Admit: 2020-08-14 | Discharge: 2020-08-14 | Disposition: A | Discharge status: Home / Self Care | Payer: Medicaid Other | Attending: Emergency Medicine | Admitting: Emergency Medicine

## 2020-08-14 DIAGNOSIS — R0789 Other chest pain: Secondary | ICD-10-CM | POA: Diagnosis not present

## 2020-08-14 DIAGNOSIS — R059 Cough, unspecified: Secondary | ICD-10-CM | POA: Insufficient documentation

## 2020-08-14 DIAGNOSIS — J45909 Unspecified asthma, uncomplicated: Secondary | ICD-10-CM | POA: Insufficient documentation

## 2020-08-14 DIAGNOSIS — R079 Chest pain, unspecified: Secondary | ICD-10-CM

## 2020-08-14 MED ORDER — ALBUTEROL SULFATE HFA 108 (90 BASE) MCG/ACT IN AERS
2.0000 | INHALATION_SPRAY | Freq: Once | RESPIRATORY_TRACT | Status: AC
  Administered 2020-08-14: 2 via RESPIRATORY_TRACT
  Filled 2020-08-14: qty 6.7

## 2020-08-14 NOTE — Discharge Instructions (Signed)
You were seen in the emergency department today for chest pain. Your work-up in the emergency department has been overall reassuring.  Your EKG & chest x-ray were normal.   Please use your inhaler 1-2 puffs every 4-6 hours as needed for wheezing, chest tightness or trouble breathing.   Take motrin and or tylenol per over the counter dosing.  Rest.   We would like you to follow up closely with your primary care provider within 1-3 days. Return to the ER immediately should you experience any new or worsening symptoms including but not limited to return of pain, worsened pain, vomiting, shortness of breath, dizziness, lightheadedness, passing out, or any other concerns that you may have.

## 2020-08-14 NOTE — ED Triage Notes (Addendum)
Pt c/o center chest pain increasing w/ movement x 2 days  Pain 5/10.  Denies SOB and n/v/d.  Hx of asthma. Sts she has been having to dust a lot for work recently. Sts inhaler has not improved symptoms.

## 2020-08-14 NOTE — ED Provider Notes (Signed)
[facility] Provider Note   CSN: [id number] Arrival date & time: 08/14/20  3557     History Chief Complaint  Patient presents with   Chest Pain    Janet Armstrong is a 25 y.o. female with a hx of asthma who presents to the ED with  complaints of intermittent chest discomfort x 2 days. Patient reports pain is to the anterior chest, feels like tightness/aching, triggered by dust, heat, getting agitated, and when wearing a wire bra. No specific alleviating factors. Associated sxs include dry cough. Hx of asthma, has not used her inhaler in over a year. Denies fever, chills, dyspnea, syncope, N/V, diaphoresis, leg pain/swelling, hemoptysis, recent surgery/trauma, recent long travel, hormone use, personal hx of cancer, or hx of DVT/PE.   HPI     Past Medical History:  Diagnosis Date   Asthma     There are no problems to display for this patient.   History reviewed. No pertinent surgical history.   OB History   No obstetric history on file.     Family History  Problem Relation Age of Onset   Diabetes Other    Hypertension Other    Cancer Other     Social History   Tobacco Use   Smoking status: Never   Smokeless tobacco: Never  Substance Use Topics   Alcohol use: Yes    Comment: social   Drug use: No    Home Medications Prior to Admission medications   Medication Sig Start Date End Date Taking? Authorizing Provider  albuterol (PROVENTIL HFA;VENTOLIN HFA) 108 (90 Base) MCG/ACT inhaler Inhale 2 puffs into the lungs every 6 (six) hours as needed for wheezing or shortness of breath.  08/28/16   [provider]  amoxicillin-clavulanate (AUGMENTIN) 875-125 MG tablet Take 1 tablet by mouth 2 (two) times daily. One po bid x 7 days 05/17/18   Muthersbaugh, Dahlia Client, PA-C  azithromycin (ZITHROMAX Z-PAK) 250 MG tablet 2 po day one, then 1 daily x 4 days 01/16/17   Fayrene Helper, PA-C  benzonatate (TESSALON) 100 MG capsule Take 1 capsule (100  mg total) by mouth every 8 (eight) hours. 01/16/17   Fayrene Helper, PA-C  etonogestrel (NEXPLANON) 68 MG IMPL implant 1 each by Subdermal route See admin instructions. 1 implant every three years 06/07/15   [provider]  fluticasone (FLONASE) 50 MCG/ACT nasal spray Place 1 spray into both nostrils in the morning and at bedtime. 06/24/20   Valinda Hoar, NP  predniSONE (DELTASONE) 20 MG tablet 2 tabs po daily x 4 days 01/16/17   Fayrene Helper, PA-C  sertraline (ZOLOFT) 25 MG tablet Take 50 mg by mouth daily. 11/13/16   [provider]    Allergies    Beef-derived products and Pork-derived products  Review of Systems   Review of Systems  Constitutional:  Negative for chills, diaphoresis and fever.  HENT:  Negative for congestion, ear pain and sore throat.   Respiratory:  Positive for cough. Negative for shortness of breath and wheezing.   Cardiovascular:  Positive for chest pain. Negative for leg swelling.  Gastrointestinal:  Negative for abdominal pain, nausea and vomiting.  Genitourinary:  Negative for dysuria.  Neurological:  Negative for syncope.  All other systems reviewed and are negative.  Physical Exam Updated Vital Signs BP 120/83 (BP Location: Left Arm)   Pulse 62   Temp 98.7 F (37.1 C) (Oral)   Resp 10   LMP 08/08/2020 (Exact Date)   SpO2 94%  Physical Exam Vitals and nursing note reviewed.  Constitutional:      General: She is not in acute distress.    Appearance: She is well-developed. She is not toxic-appearing.  HENT:     Head: Normocephalic and atraumatic.  Eyes:     General:        Right eye: No discharge.        Left eye: No discharge.     Conjunctiva/sclera: Conjunctivae normal.  Cardiovascular:     Rate and Rhythm: Normal rate and regular rhythm.     Pulses:          Radial pulses are 2+ on the right side and 2+ on the left side.  Pulmonary:     Effort: Pulmonary effort is normal. No respiratory distress.     Breath sounds: Normal  breath sounds. No wheezing, rhonchi or rales.  Abdominal:     General: There is no distension.     Palpations: Abdomen is soft.     Tenderness: There is no abdominal tenderness.  Musculoskeletal:     Cervical back: Neck supple.     Right lower leg: No tenderness. No edema.     Left lower leg: No tenderness. No edema.  Skin:    General: Skin is warm and dry.     Findings: No rash.  Neurological:     Mental Status: She is alert.     Comments: Clear speech.   Psychiatric:        Behavior: Behavior normal.    ED Results / Procedures / Treatments   Labs (all labs ordered are listed, but only abnormal results are displayed) Labs Reviewed - No data to display  EKG None  Radiology DG Chest 2 View  Result Date: 08/14/2020 CLINICAL DATA:  Mid chest pain. EXAM: CHEST - 2 VIEW COMPARISON:  01/16/2017 FINDINGS: The heart size and mediastinal contours are within normal limits. There is no evidence of pulmonary edema, consolidation, pneumothorax or pleural fluid. The visualized skeletal structures are unremarkable. IMPRESSION: No active cardiopulmonary disease. Electronically Signed   By: Irish Lack M.D.   On: 08/14/2020 09:39    Procedures Procedures   Medications Ordered in ED Medications  albuterol (VENTOLIN HFA) 108 (90 Base) MCG/ACT inhaler 2 puff (has no administration in time range)    ED Course  I have reviewed the triage vital signs and the nursing notes.  Pertinent labs & imaging results that were available during my care of the patient were reviewed by me and considered in my medical decision making (see chart for details).    MDM Rules/Calculators/A&P                           Patient presents to the emergency department with chest pain. Patient nontoxic appearing, in no apparent distress, vitals without significant abnormality.   Additional history obtained:  Additional history obtained from chart review & nursing note review.   EKG: Sinus rhythm, no acute  ischemia.   Imaging Studies ordered:  CXR obtained, I independently reviewed, formal radiology impression shows:  No active cardiopulmonary disease.   ED Course:  Patient without significant cardiac risk factors, EKG without acute ischemia- doubt ACS. Patient is low risk wells, doubt pulmonary embolism. Pain is not a tearing sensation, symmetric pulses, no widening of mediastinum on CXR, doubt dissection. CXR without pneumonia, pneumothorax, or fluid overload. EKG without findings of pericarditis. No significant wheezing to suggest asthma exacerbation, will however provide patient  w/ new inhaler in the ED. Recommend motrin/tylenol. PCP follow up.   I discussed results, treatment plan, need for PCP follow-up, and return precautions with the patient. Provided opportunity for questions, patient confirmed understanding and is in agreement with plan.   Findings and plan of care discussed with supervising physician Dr. Rodena Medin who is in agreement.   Portions of this note were generated with Scientist, clinical (histocompatibility and immunogenetics). Dictation errors may occur despite best attempts at proofreading.    Final Clinical Impression(s) / ED Diagnoses Final diagnoses:  Chest pain, unspecified type    Rx / DC Orders ED Discharge Orders     None        Cherly Anderson, PA-C 08/14/20 1038    Wynetta Fines, MD 08/14/20 1408

## 2020-08-14 NOTE — ED Notes (Signed)
Verbalized understanding discharge instructions and follow-up. In no acute distress.   

## 2020-08-14 NOTE — ED Provider Notes (Addendum)
Emergency Medicine Provider Triage Evaluation Note  Janet Armstrong , a 25 y.o. female  was evaluated in triage.  Pt complains of anterior chest discomfort x2 days.  This is not associated with shortness of breath or nausea, vomiting, or diaphoresis.  Patient with history of asthma.  She does not feel that her asthma symptoms are significantly worse.  She denies discomfort at present.  She denies current wheezing or dyspnea.  Review of Systems  Positive: Chest discomfort Negative: She denies associated nausea, vomiting, diaphoresis, fever, cough, significant asthma symptoms, or other complaint  Physical Exam  BP 120/83 (BP Location: Left Arm)   Pulse 62   Temp 98.7 F (37.1 C) (Oral)   Resp 10   LMP 08/10/2020 (Exact Date)   SpO2 94%  Gen:   Awake, no distress   Resp:  Normal effort, CTA bilaterally Cardiac: RRR, no m/g/r MSK:   Moves extremities without difficulty        Medical Decision Making  Medically screening exam initiated at 9:19 AM.  Appropriate orders placed.  Janet Armstrong was informed that the remainder of the evaluation will be completed by another provider, this initial triage assessment does not replace that evaluation, and the importance of remaining in the ED until their evaluation is complete.  Patient is presenting with complaint of chest discomfort.  Described symptoms or not consistent with likely ACS.  Will obtain screening EKG and chest x-ray.  0825   EKG without evidence of acute ischemia   Janet Fines, MD 08/14/20 3557    Janet Fines, MD 08/14/20 1049

## 2020-08-26 ENCOUNTER — Other Ambulatory Visit: Payer: Self-pay

## 2020-08-26 ENCOUNTER — Encounter (HOSPITAL_COMMUNITY): Payer: Self-pay

## 2020-08-26 ENCOUNTER — Ambulatory Visit (HOSPITAL_COMMUNITY)
Admission: EM | Admit: 2020-08-26 | Discharge: 2020-08-26 | Disposition: A | Discharge status: Home / Self Care | Payer: Medicaid Other | Attending: Emergency Medicine | Admitting: Emergency Medicine

## 2020-08-26 DIAGNOSIS — F411 Generalized anxiety disorder: Secondary | ICD-10-CM

## 2020-08-26 DIAGNOSIS — J4530 Mild persistent asthma, uncomplicated: Secondary | ICD-10-CM

## 2020-08-26 MED ORDER — BUDESONIDE-FORMOTEROL FUMARATE 80-4.5 MCG/ACT IN AERO: 2.0000 | INHALATION_SPRAY | Freq: Every day | RESPIRATORY_TRACT | 12 refills | Status: DC

## 2020-08-26 MED ORDER — SPIRIVA RESPIMAT 1.25 MCG/ACT IN AERS: 2.0000 | INHALATION_SPRAY | Freq: Every day | RESPIRATORY_TRACT | 1 refills | Status: DC

## 2020-08-26 MED ORDER — HYDROXYZINE HCL 25 MG PO TABS: 25.0000 mg | ORAL_TABLET | Freq: Four times a day (QID) | ORAL | 0 refills | Status: AC

## 2020-08-26 NOTE — ED Triage Notes (Signed)
Pt presents for follow up after chest pain workup last month at [facility] ED.

## 2020-08-26 NOTE — ED Provider Notes (Signed)
[facility]    CSN: [id number] Arrival date & time: 08/26/20  1227      History   Chief Complaint Chief Complaint  Patient presents with   Follow-up    HPI Janet Armstrong is a 25 y.o. female.   Patient presents with intermittent chest tightness beginning 1 month ago. Pain in central chest and does not radiate.Worsened when in heat or with agitation. Feels symptoms became prominent with personal life became more stressful. Has been using albuterol inhaler, requiring 4 puffs to settle symptoms. Seen in ED 1 month ago for similar symptoms, feels they have not worsened but have not improved. Denies shortness of breath, chest pain, cough, wheezing, tachycardia, palpitations,fever, chills, pain when breathing, trauma to injury, recent travel, hormone use,  History of asthma and anxiety. No current anxiety medication.   Past Medical History:  Diagnosis Date   Asthma     There are no problems to display for this patient.   History reviewed. No pertinent surgical history.  OB History   No obstetric history on file.      Home Medications    Prior to Admission medications   Medication Sig Start Date End Date Taking? Authorizing Provider  budesonide-formoterol (SYMBICORT) 80-4.5 MCG/ACT inhaler Inhale 2 puffs into the lungs daily. 08/26/20  Yes Fenix Rorke, Elita Boone, NP  hydrOXYzine (ATARAX/VISTARIL) 25 MG tablet Take 1 tablet (25 mg total) by mouth every 6 (six) hours. 08/26/20  Yes Prospero Mahnke R, NP  albuterol (PROVENTIL HFA;VENTOLIN HFA) 108 (90 Base) MCG/ACT inhaler Inhale 2 puffs into the lungs every 6 (six) hours as needed for wheezing or shortness of breath.  08/28/16   [provider]  benzonatate (TESSALON) 100 MG capsule Take 1 capsule (100 mg total) by mouth every 8 (eight) hours. 01/16/17   Fayrene Helper, PA-C  etonogestrel (NEXPLANON) 68 MG IMPL implant 1 each by Subdermal route See admin instructions. 1 implant every three years 06/07/15   [provider]  fluticasone (FLONASE) 50 MCG/ACT nasal spray Place 1 spray into both nostrils in the morning and at bedtime. 06/24/20   Valinda Hoar, NP  sertraline (ZOLOFT) 25 MG tablet Take 50 mg by mouth daily. 11/13/16   [provider]    Family History Family History  Problem Relation Age of Onset   Diabetes Other    Hypertension Other    Cancer Other     Social History Social History   Tobacco Use   Smoking status: Never   Smokeless tobacco: Never  Substance Use Topics   Alcohol use: Yes    Comment: social   Drug use: No     Allergies   Beef-derived products and Pork-derived products   Review of Systems Review of Systems  Constitutional: Negative.   HENT: Negative.    Respiratory:  Positive for chest tightness. Negative for apnea, cough, choking, shortness of breath, wheezing and stridor.   Cardiovascular: Negative.   Gastrointestinal: Negative.   Neurological: Negative.     Physical Exam Triage Vital Signs ED Triage Vitals  Enc Vitals Group     BP 08/26/20 1345 114/75     Pulse Rate 08/26/20 1345 79     Resp 08/26/20 1345 18     Temp 08/26/20 1345 98.6 F (37 C)     Temp Source 08/26/20 1345 Oral     SpO2 08/26/20 1345 95 %     Weight --      Height --      Head Circumference --  Peak Flow --      Pain Score 08/26/20 1349 0     Pain Loc --      Pain Edu? --      Excl. in GC? --    No data found.  Updated Vital Signs BP 114/75 (BP Location: Right Arm)   Pulse 79   Temp 98.6 F (37 C) (Oral)   Resp 18   LMP 08/08/2020 (Exact Date)   SpO2 95%   Visual Acuity Right Eye Distance:   Left Eye Distance:   Bilateral Distance:    Right Eye Near:   Left Eye Near:    Bilateral Near:     Physical Exam Constitutional:      Appearance: Normal appearance. She is normal weight.  HENT:     Head: Normocephalic.  Eyes:     Extraocular Movements: Extraocular movements intact.  Cardiovascular:     Rate and Rhythm: Normal rate and regular  rhythm.     Pulses: Normal pulses.     Heart sounds: Normal heart sounds.  Pulmonary:     Effort: Pulmonary effort is normal.     Breath sounds: Normal breath sounds.  Musculoskeletal:        General: Normal range of motion.  Skin:    General: Skin is warm and dry.  Neurological:     Mental Status: She is alert and oriented to person, place, and time. Mental status is at baseline.  Psychiatric:        Mood and Affect: Mood normal.        Behavior: Behavior normal.     UC Treatments / Results  Labs (all labs ordered are listed, but only abnormal results are displayed) Labs Reviewed - No data to display  EKG   Radiology No results found.  Procedures Procedures (including critical care time)  Medications Ordered in UC Medications - No data to display  Initial Impression / Assessment and Plan / UC Course  I have reviewed the triage vital signs and the nursing notes.  Pertinent labs & imaging results that were available during my care of the patient were reviewed by me and considered in my medical decision making (see chart for details).  Mild persistent asthma Anxiety  Symbicort 2 puffs daily Continue use of albuterol prn Hydroxyzine 25 mg every 6 hours prn, Behavior health follow up if medication helpful  PCP referral placed for asthma management  Final Clinical Impressions(s) / UC Diagnoses   Final diagnoses:  Anxiety state  Mild persistent asthma without complication     Discharge Instructions      Use Symbicort inhaler 2 puffs every morning  Can use of albuterol inhaler 2 puffs every 6 hours as needed  Can try hydroxyzine tablet if feeling anxious, be mindful this medication may make you drowsy, take first dose at home to see how your body reacts  If you feel like the hydroxyzine tablet is helpful may follow-up with behavioral health, information listed below to be evaluated for anxiety medication  Primary care referral has been placed so that you may  establish care so that your asthma may be monitored   ED Prescriptions     Medication Sig Dispense Auth. Provider   hydrOXYzine (ATARAX/VISTARIL) 25 MG tablet Take 1 tablet (25 mg total) by mouth every 6 (six) hours. 12 tablet Ellouise Mcwhirter R, NP   Tiotropium Bromide Monohydrate (SPIRIVA RESPIMAT) 1.25 MCG/ACT AERS  (Status: Discontinued) Inhale 2 puffs into the lungs daily. 4 g [place], Bhavya Eschete R,  NP   budesonide-formoterol (SYMBICORT) 80-4.5 MCG/ACT inhaler Inhale 2 puffs into the lungs daily. 1 each Valinda Hoar, NP      PDMP not reviewed this encounter.   Valinda Hoar, [place] 08/26/20 [phone]

## 2020-08-26 NOTE — Discharge Instructions (Addendum)
Use Symbicort inhaler 2 puffs every morning  Can use of albuterol inhaler 2 puffs every 6 hours as needed  Can try hydroxyzine tablet if feeling anxious, be mindful this medication may make you drowsy, take first dose at home to see how your body reacts  If you feel like the hydroxyzine tablet is helpful may follow-up with behavioral health, information listed below to be evaluated for anxiety medication  Primary care referral has been placed so that you may establish care so that your asthma may be monitored

## 2020-11-17 ENCOUNTER — Encounter (HOSPITAL_COMMUNITY): Payer: Self-pay

## 2020-11-17 ENCOUNTER — Other Ambulatory Visit: Payer: Self-pay

## 2020-11-17 ENCOUNTER — Ambulatory Visit (HOSPITAL_COMMUNITY)
Admission: EM | Admit: 2020-11-17 | Discharge: 2020-11-17 | Disposition: A | Discharge status: Home / Self Care | Payer: Medicaid Other | Attending: Student | Admitting: Student

## 2020-11-17 ENCOUNTER — Emergency Department (HOSPITAL_COMMUNITY)
Admission: EM | Admit: 2020-11-17 | Discharge: 2020-11-17 | Disposition: A | Discharge status: Home / Self Care | Payer: Medicaid Other | Attending: Emergency Medicine | Admitting: Emergency Medicine

## 2020-11-17 DIAGNOSIS — J45901 Unspecified asthma with (acute) exacerbation: Secondary | ICD-10-CM | POA: Insufficient documentation

## 2020-11-17 DIAGNOSIS — Z7952 Long term (current) use of systemic steroids: Secondary | ICD-10-CM | POA: Insufficient documentation

## 2020-11-17 DIAGNOSIS — Z760 Encounter for issue of repeat prescription: Secondary | ICD-10-CM

## 2020-11-17 DIAGNOSIS — J4541 Moderate persistent asthma with (acute) exacerbation: Secondary | ICD-10-CM | POA: Diagnosis not present

## 2020-11-17 MED ORDER — IPRATROPIUM BROMIDE 0.02 % IN SOLN
0.5000 mg | Freq: Once | RESPIRATORY_TRACT | Status: AC
  Administered 2020-11-17: 0.5 mg via RESPIRATORY_TRACT
  Filled 2020-11-17: qty 2.5

## 2020-11-17 MED ORDER — PREDNISONE 50 MG PO TABS: 50.0000 mg | ORAL_TABLET | Freq: Every day | ORAL | 0 refills | Status: AC

## 2020-11-17 MED ORDER — PREDNISONE 20 MG PO TABS
60.0000 mg | ORAL_TABLET | Freq: Once | ORAL | Status: AC
  Administered 2020-11-17: 60 mg via ORAL
  Filled 2020-11-17: qty 3

## 2020-11-17 MED ORDER — BUDESONIDE-FORMOTEROL FUMARATE 80-4.5 MCG/ACT IN AERO: 2.0000 | INHALATION_SPRAY | Freq: Every day | RESPIRATORY_TRACT | 1 refills | Status: AC

## 2020-11-17 MED ORDER — ALBUTEROL SULFATE HFA 108 (90 BASE) MCG/ACT IN AERS: 2.0000 | INHALATION_SPRAY | Freq: Four times a day (QID) | RESPIRATORY_TRACT | 2 refills | Status: AC | PRN

## 2020-11-17 MED ORDER — ALBUTEROL SULFATE (2.5 MG/3ML) 0.083% IN NEBU
5.0000 mg | INHALATION_SOLUTION | Freq: Once | RESPIRATORY_TRACT | Status: AC
  Administered 2020-11-17: 5 mg via RESPIRATORY_TRACT
  Filled 2020-11-17: qty 6

## 2020-11-17 NOTE — Discharge Instructions (Addendum)
Take prednisone as directed   Use 2 puffs of the albuterol inhaler as needed every 4-6 hours  Please follow up with your primary care provider within 5-7 days for re-evaluation of your symptoms. If you do not have a primary care provider, information for a healthcare clinic has been provided for you to make arrangements for follow up care. Please return to the emergency department for any new or worsening symptoms.

## 2020-11-17 NOTE — Discharge Instructions (Addendum)
-  Albuterol inhaler as needed for cough, wheezing, shortness of breath, 1 to 2 puffs every 6 hours as needed. -I also refilled the symbicort - two pulls daily

## 2020-11-17 NOTE — ED Provider Notes (Signed)
[facility]-EMERGENCY DEPT Provider Note   CSN: [id number] Arrival date & time: 11/17/20  1533     History Chief Complaint  Patient presents with   Asthma    Janet Armstrong is a 25 y.o. female.  HPI   25 y/o female with a h/o asthma who presents to the ED today for eval of asthma exacerbation. States sxs have been present and severe for 2 days. Sxs are constant. She reports wheezing, cough, sob. Denies fevers, chest pain.   Past Medical History:  Diagnosis Date   Asthma     There are no problems to display for this patient.   History reviewed. No pertinent surgical history.   OB History   No obstetric history on file.     Family History  Problem Relation Age of Onset   Diabetes Other    Hypertension Other    Cancer Other     Social History   Tobacco Use   Smoking status: Never   Smokeless tobacco: Never  Substance Use Topics   Alcohol use: Yes    Comment: social   Drug use: No    Home Medications Prior to Admission medications   Medication Sig Start Date End Date Taking? Authorizing Provider  predniSONE (DELTASONE) 50 MG tablet Take 1 tablet (50 mg total) by mouth daily for 5 days. 11/17/20 11/22/20 Yes Raelene Trew S, PA-C  albuterol (VENTOLIN HFA) 108 (90 Base) MCG/ACT inhaler Inhale 2 puffs into the lungs every 6 (six) hours as needed for wheezing or shortness of breath. 11/17/20   Rhys Martini, PA-C  budesonide-formoterol [facility]) 80-4.5 MCG/ACT inhaler Inhale 2 puffs into the lungs daily. 11/17/20   Rhys Martini, PA-C  etonogestrel (NEXPLANON) 68 MG IMPL implant 1 each by Subdermal route See admin instructions. 1 implant every three years 06/07/15   [provider]  fluticasone (FLONASE) 50 MCG/ACT nasal spray Place 1 spray into both nostrils in the morning and at bedtime. 06/24/20   Valinda Hoar, NP  hydrOXYzine (ATARAX/VISTARIL) 25 MG tablet Take 1 tablet (25 mg total) by mouth every 6 (six) hours. 08/26/20    Valinda Hoar, NP  sertraline (ZOLOFT) 25 MG tablet Take 50 mg by mouth daily. 11/13/16   [provider]    Allergies    Beef-derived products and Pork-derived products  Review of Systems   Review of Systems  Constitutional:  Negative for fever.  HENT:  Positive for congestion. Negative for ear pain and sore throat.   Eyes:  Negative for visual disturbance.  Respiratory:  Positive for cough, shortness of breath and wheezing.   Cardiovascular:  Negative for chest pain.  Gastrointestinal:  Negative for abdominal pain, constipation, diarrhea, nausea and vomiting.  Genitourinary:  Negative for dysuria and hematuria.  Musculoskeletal:  Negative for back pain.  Skin:  Negative for rash.  Neurological:  Negative for seizures and syncope.  All other systems reviewed and are negative.  Physical Exam Updated Vital Signs BP (!) 132/92 (BP Location: Left Arm)   Pulse (!) 109   Temp 98.4 F (36.9 C) (Oral)   Resp 18   Ht 5\' 6"  (1.676 m)   Wt 77.1 kg   SpO2 96%   BMI 27.44 kg/m   Physical Exam Vitals and nursing note reviewed.  Constitutional:      General: She is not in acute distress.    Appearance: She is well-developed.  HENT:     Head: Normocephalic and atraumatic.  Eyes:  Conjunctiva/sclera: Conjunctivae normal.  Cardiovascular:     Rate and Rhythm: Normal rate and regular rhythm.     Heart sounds: Normal heart sounds. No murmur heard. Pulmonary:     Effort: Pulmonary effort is normal. No respiratory distress.     Breath sounds: Wheezing present. No rales.  Abdominal:     General: Bowel sounds are normal.     Palpations: Abdomen is soft.     Tenderness: There is no abdominal tenderness. There is no guarding or rebound.  Musculoskeletal:     Cervical back: Neck supple.  Skin:    General: Skin is warm and dry.  Neurological:     Mental Status: She is alert.    ED Results / Procedures / Treatments   Labs (all labs ordered are listed, but only  abnormal results are displayed) Labs Reviewed - No data to display  EKG None  Radiology No results found.  Procedures Procedures   Medications Ordered in ED Medications  albuterol (PROVENTIL) (2.5 MG/3ML) 0.083% nebulizer solution 5 mg (5 mg Nebulization Given 11/17/20 1650)  ipratropium (ATROVENT) nebulizer solution 0.5 mg (0.5 mg Nebulization Given 11/17/20 1650)  predniSONE (DELTASONE) tablet 60 mg (60 mg Oral Given 11/17/20 1650)    ED Course  I have reviewed the triage vital signs and the nursing notes.  Pertinent labs & imaging results that were available during my care of the patient were reviewed by me and considered in my medical decision making (see chart for details).    MDM Rules/Calculators/A&P                          Patient in ED with O2 saturations maintained >95, no current signs of respiratory distress. Pt still wheezing after breathing treatment but states she feel significantly better. Prednisone given in the ED and pt will bd dc with 5 day burst. Pt states they are breathing close to baseline and are comfortable with the plan for d/c. Pt has been instructed to continue using prescribed medications and to speak with PCP about today's exacerbation.     Final Clinical Impression(s) / ED Diagnoses Final diagnoses:  Exacerbation of asthma, unspecified asthma severity, unspecified whether persistent    Rx / DC Orders ED Discharge Orders          Ordered    predniSONE (DELTASONE) 50 MG tablet  Daily        11/17/20 [address] [place], PA-C 11/17/20 1724    Jacalyn Lefevre, MD 11/17/20 1738

## 2020-11-17 NOTE — ED Triage Notes (Signed)
Pt c/o cough and nasal congestion x3 days. Asthma and SOB x 24 hours, states out of her inhaler. No distress noted, speaking in complete sentences.

## 2020-11-17 NOTE — ED Provider Notes (Signed)
[facility]    CSN: [id number] Arrival date & time: 11/17/20  1254      History   Chief Complaint Chief Complaint  Patient presents with   Asthma    HPI Altamese Deguire is a 25 y.o. female presenting with asthma exacerbation following running out of her albuterol and symbicort inhalers 1 day ago.  Medical history asthma. Describes wheezing and some SOB. Recent URI with congestion and nonproductive cough. Denies fevers/chills.  HPI  Past Medical History:  Diagnosis Date   Asthma     There are no problems to display for this patient.   History reviewed. No pertinent surgical history.  OB History   No obstetric history on file.      Home Medications    Prior to Admission medications   Medication Sig Start Date End Date Taking? Authorizing Provider  albuterol (VENTOLIN HFA) 108 (90 Base) MCG/ACT inhaler Inhale 2 puffs into the lungs every 6 (six) hours as needed for wheezing or shortness of breath. 11/17/20   Rhys Martini, PA-C  budesonide-formoterol [facility]) 80-4.5 MCG/ACT inhaler Inhale 2 puffs into the lungs daily. 11/17/20   Rhys Martini, PA-C  etonogestrel (NEXPLANON) 68 MG IMPL implant 1 each by Subdermal route See admin instructions. 1 implant every three years 06/07/15   [provider]  fluticasone (FLONASE) 50 MCG/ACT nasal spray Place 1 spray into both nostrils in the morning and at bedtime. 06/24/20   Valinda Hoar, NP  hydrOXYzine (ATARAX/VISTARIL) 25 MG tablet Take 1 tablet (25 mg total) by mouth every 6 (six) hours. 08/26/20   Valinda Hoar, NP  sertraline (ZOLOFT) 25 MG tablet Take 50 mg by mouth daily. 11/13/16   [provider]    Family History Family History  Problem Relation Age of Onset   Diabetes Other    Hypertension Other    Cancer Other     Social History Social History   Tobacco Use   Smoking status: Never   Smokeless tobacco: Never  Substance Use Topics   Alcohol use: Yes    Comment: social    Drug use: No     Allergies   Beef-derived products and Pork-derived products   Review of Systems Review of Systems  Constitutional:  Negative for appetite change, chills and fever.  HENT:  Positive for congestion. Negative for ear pain, rhinorrhea, sinus pressure, sinus pain and sore throat.   Eyes:  Negative for redness and visual disturbance.  Respiratory:  Positive for cough and wheezing. Negative for chest tightness and shortness of breath.   Cardiovascular:  Negative for chest pain and palpitations.  Gastrointestinal:  Negative for abdominal pain, constipation, diarrhea, nausea and vomiting.  Genitourinary:  Negative for dysuria, frequency and urgency.  Musculoskeletal:  Negative for myalgias.  Neurological:  Negative for dizziness, weakness and headaches.  Psychiatric/Behavioral:  Negative for confusion.   All other systems reviewed and are negative.   Physical Exam Triage Vital Signs ED Triage Vitals  Enc Vitals Group     BP 11/17/20 1258 139/87     Pulse Rate 11/17/20 1258 98     Resp 11/17/20 1258 18     Temp 11/17/20 1258 98.5 F (36.9 C)     Temp Source 11/17/20 1258 Oral     SpO2 11/17/20 1258 97 %     Weight --      Height --      Head Circumference --      Peak Flow --  Pain Score 11/17/20 1259 0     Pain Loc --      Pain Edu? --      Excl. in GC? --    No data found.  Updated Vital Signs BP 139/87 (BP Location: Left Arm)   Pulse 98   Temp 98.5 F (36.9 C) (Oral)   Resp 18   SpO2 97%   Visual Acuity Right Eye Distance:   Left Eye Distance:   Bilateral Distance:    Right Eye Near:   Left Eye Near:    Bilateral Near:     Physical Exam Vitals reviewed.  Constitutional:      General: She is not in acute distress.    Appearance: Normal appearance. She is not ill-appearing.  HENT:     Head: Normocephalic and atraumatic.     Right Ear: Tympanic membrane, ear canal and external ear normal. No tenderness. No middle ear effusion. There  is no impacted cerumen. Tympanic membrane is not perforated, erythematous, retracted or bulging.     Left Ear: Tympanic membrane, ear canal and external ear normal. No tenderness.  No middle ear effusion. There is no impacted cerumen. Tympanic membrane is not perforated, erythematous, retracted or bulging.     Nose: Congestion present.     Mouth/Throat:     Mouth: Mucous membranes are moist.     Pharynx: Uvula midline. No oropharyngeal exudate or posterior oropharyngeal erythema.  Eyes:     Extraocular Movements: Extraocular movements intact.     Pupils: Pupils are equal, round, and reactive to light.  Cardiovascular:     Rate and Rhythm: Normal rate and regular rhythm.     Heart sounds: Normal heart sounds.  Pulmonary:     Effort: Pulmonary effort is normal.     Breath sounds: Wheezing present. No decreased breath sounds, rhonchi or rales.     Comments: Expiratory wheezes throughout  Abdominal:     Palpations: Abdomen is soft.     Tenderness: There is no abdominal tenderness. There is no guarding or rebound.  Neurological:     General: No focal deficit present.     Mental Status: She is alert and oriented to person, place, and time.  Psychiatric:        Mood and Affect: Mood normal.        Behavior: Behavior normal.        Thought Content: Thought content normal.        Judgment: Judgment normal.     UC Treatments / Results  Labs (all labs ordered are listed, but only abnormal results are displayed) Labs Reviewed - No data to display  EKG   Radiology No results found.  Procedures Procedures (including critical care time)  Medications Ordered in UC Medications - No data to display  Initial Impression / Assessment and Plan / UC Course  I have reviewed the triage vital signs and the nursing notes.  Pertinent labs & imaging results that were available during my care of the patient were reviewed by me and considered in my medical decision making (see chart for  details).     This patient is a very pleasant 25 y.o. year old female presenting with asthma exacerbation following viral URI. Today this pt is afebrile nontachycardic nontachypneic, oxygenating well on room air, wheezes throughout but no rhonchi or rales.   Albuterol and symbicort inhalers refilled .  ED return precautions discussed. Patient verbalizes understanding and agreement.   Level 4 for acute exacerbation of chronic condition  and prescription drug management. .   Final Clinical Impressions(s) / UC Diagnoses   Final diagnoses:  Moderate persistent asthma with exacerbation  Medication refill     Discharge Instructions      -Albuterol inhaler as needed for cough, wheezing, shortness of breath, 1 to 2 puffs every 6 hours as needed. -I also refilled the symbicort - two pulls daily     ED Prescriptions     Medication Sig Dispense Auth. Provider   albuterol (VENTOLIN HFA) 108 (90 Base) MCG/ACT inhaler Inhale 2 puffs into the lungs every 6 (six) hours as needed for wheezing or shortness of breath. 1 each Rhys Martini, PA-C   budesonide-formoterol [facility]) 80-4.5 MCG/ACT inhaler Inhale 2 puffs into the lungs daily. 1 each Rhys Martini, PA-C      PDMP not reviewed this encounter.   Rhys Martini, PA-C 11/17/20 1504

## 2022-05-29 IMAGING — CR DG CHEST 2V
2 series · 2 of 2 positions shown · non-contrast
Comparison: 01/16/2017

CLINICAL DATA: Mid chest pain.

EXAM:
CHEST - 2 VIEW

[w chest pa]
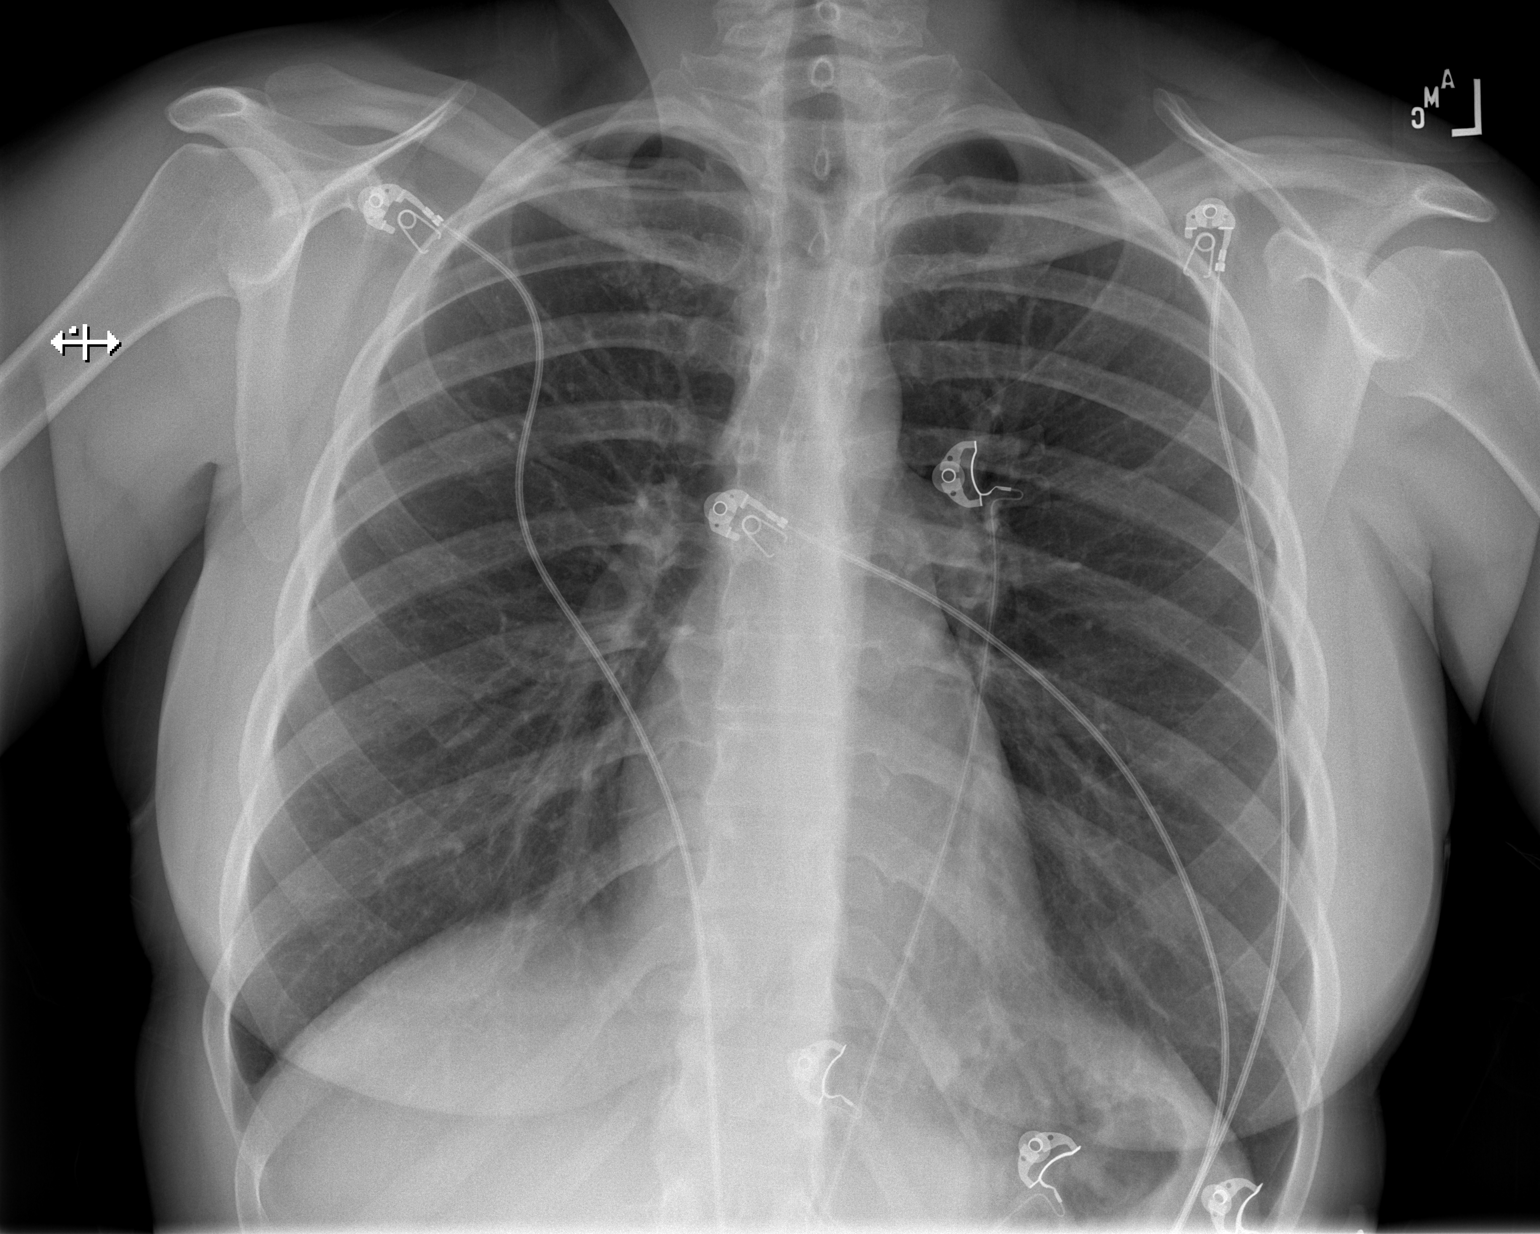

[w chest lat]
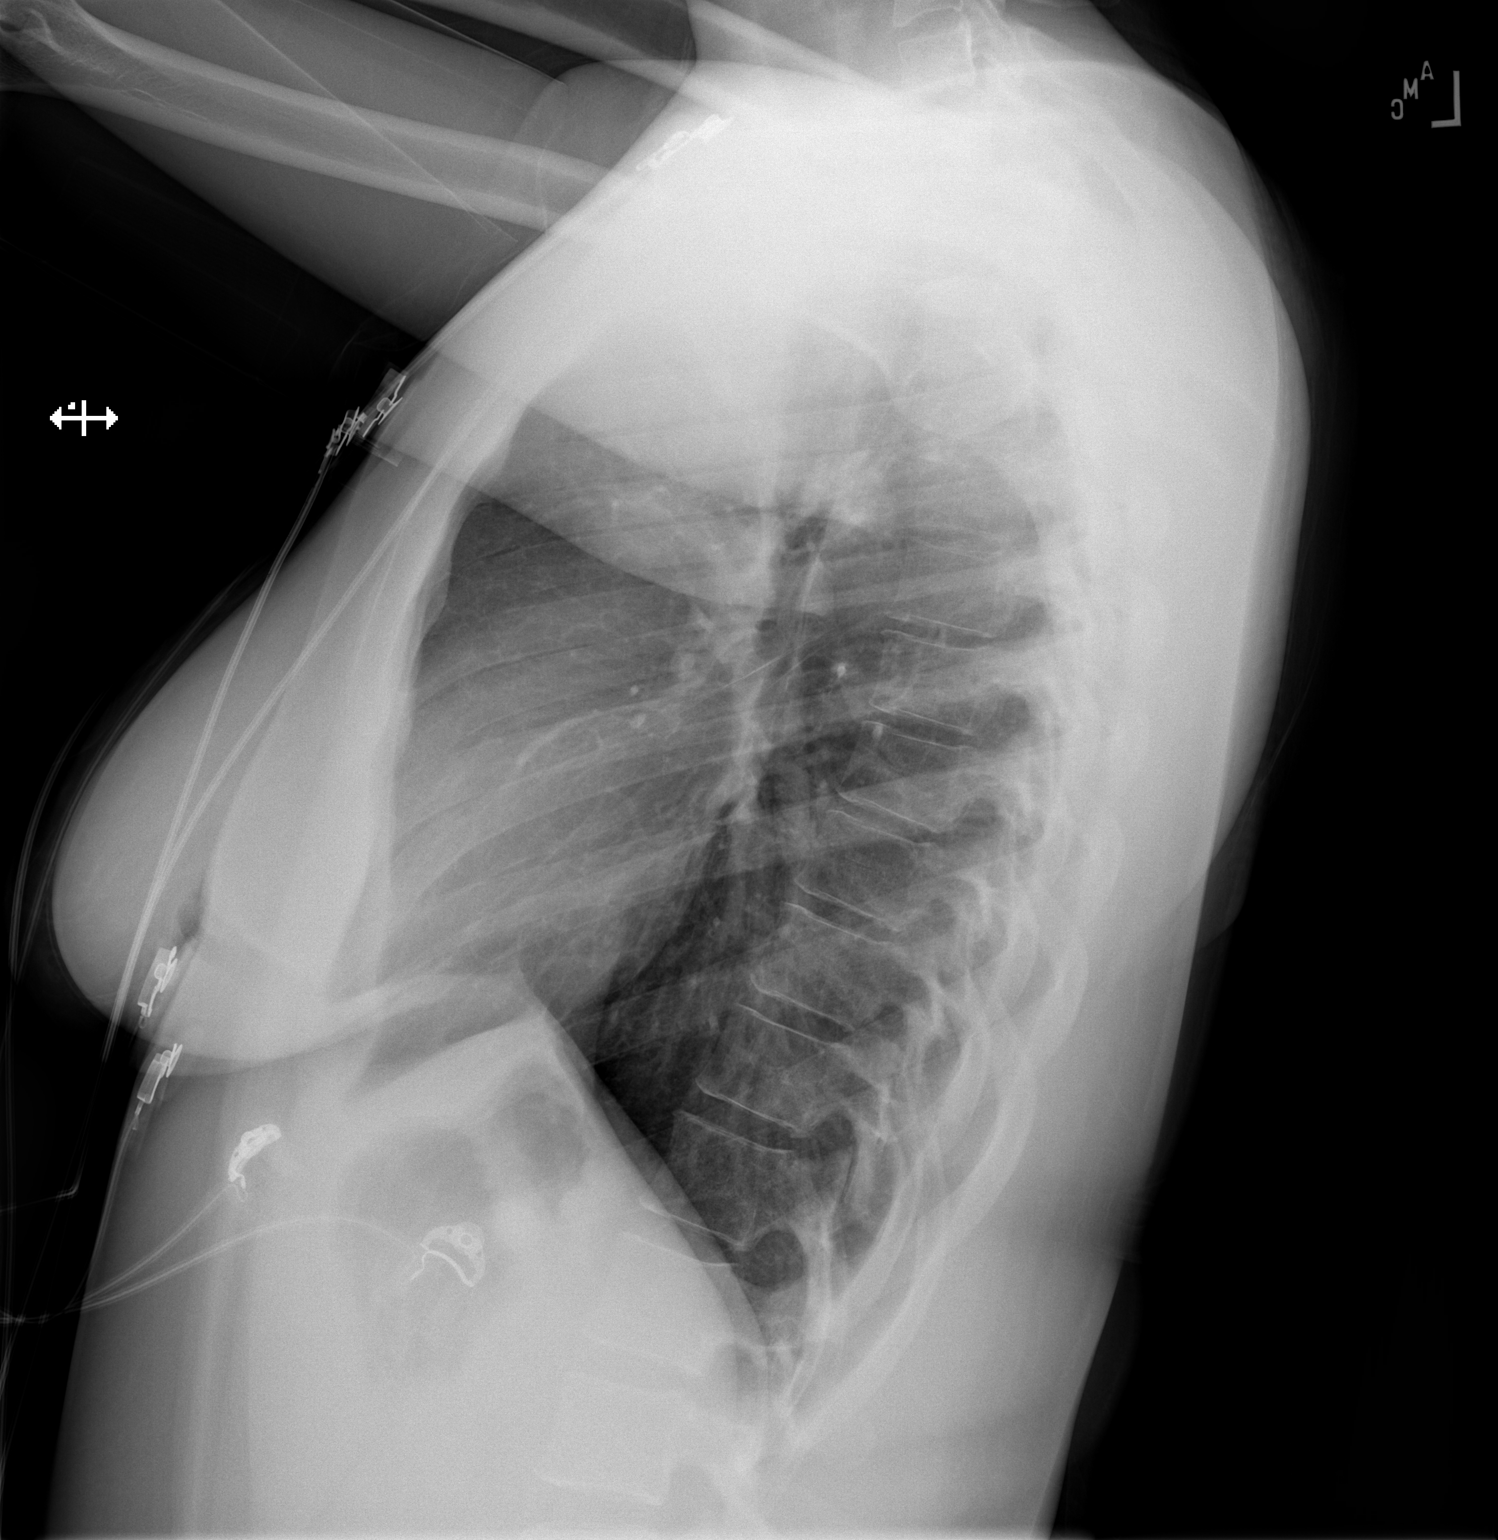

[2 of 2 positions shown; findings below may reference images not displayed]

FINDINGS: The heart size and mediastinal contours are within normal limits.
There is no evidence of pulmonary edema, consolidation, pneumothorax
or pleural fluid. The visualized skeletal structures are
unremarkable.
IMPRESSION: No active cardiopulmonary disease.
